# Patient Record
Sex: Female | Born: 1955 | Race: White | Hispanic: No | Marital: Married | Smoking: Never smoker
Health system: Southern US, Community
[De-identification: ages and names within clinical notes are randomized; demographics above are authoritative.]

## PROBLEM LIST (undated history)

## (undated) DIAGNOSIS — M1990 Unspecified osteoarthritis, unspecified site: Secondary | ICD-10-CM

## (undated) DIAGNOSIS — F419 Anxiety disorder, unspecified: Secondary | ICD-10-CM

## (undated) DIAGNOSIS — R112 Nausea with vomiting, unspecified: Secondary | ICD-10-CM

## (undated) DIAGNOSIS — R569 Unspecified convulsions: Secondary | ICD-10-CM

## (undated) DIAGNOSIS — Z9889 Other specified postprocedural states: Secondary | ICD-10-CM

## (undated) DIAGNOSIS — F32A Depression, unspecified: Secondary | ICD-10-CM

## (undated) DIAGNOSIS — D649 Anemia, unspecified: Secondary | ICD-10-CM

## (undated) DIAGNOSIS — C801 Malignant (primary) neoplasm, unspecified: Secondary | ICD-10-CM

## (undated) DIAGNOSIS — R110 Nausea: Secondary | ICD-10-CM

## (undated) DIAGNOSIS — T4145XA Adverse effect of unspecified anesthetic, initial encounter: Secondary | ICD-10-CM

## (undated) DIAGNOSIS — B1910 Unspecified viral hepatitis B without hepatic coma: Secondary | ICD-10-CM

## (undated) DIAGNOSIS — I959 Hypotension, unspecified: Secondary | ICD-10-CM

## (undated) DIAGNOSIS — K8001 Calculus of gallbladder with acute cholecystitis with obstruction: Secondary | ICD-10-CM

## (undated) DIAGNOSIS — Z8659 Personal history of other mental and behavioral disorders: Secondary | ICD-10-CM

## (undated) DIAGNOSIS — Z8619 Personal history of other infectious and parasitic diseases: Secondary | ICD-10-CM

## (undated) DIAGNOSIS — F329 Major depressive disorder, single episode, unspecified: Secondary | ICD-10-CM

## (undated) DIAGNOSIS — Z8669 Personal history of other diseases of the nervous system and sense organs: Secondary | ICD-10-CM

## (undated) HISTORY — DX: Unspecified convulsions: R56.9

## (undated) HISTORY — DX: Anxiety disorder, unspecified: F41.9

## (undated) HISTORY — DX: Anemia, unspecified: D64.9

## (undated) HISTORY — PX: WRIST SURGERY: SHX841

## (undated) HISTORY — PX: BUNIONECTOMY: SHX129

## (undated) HISTORY — PX: FOOT SURGERY: SHX648

## (undated) HISTORY — DX: Hypotension, unspecified: I95.9

## (undated) HISTORY — PX: COLONOSCOPY: SHX174

## (undated) HISTORY — PX: KNEE SURGERY: SHX244

## (undated) HISTORY — DX: Depression, unspecified: F32.A

## (undated) HISTORY — DX: Major depressive disorder, single episode, unspecified: F32.9

## (undated) HISTORY — DX: Unspecified viral hepatitis B without hepatic coma: B19.10

---

## 1990-03-18 HISTORY — PX: DIAGNOSTIC LAPAROSCOPY: SUR761

## 2009-05-19 ENCOUNTER — Ambulatory Visit: Payer: Self-pay | Admitting: Family Medicine

## 2009-05-19 DIAGNOSIS — M19049 Primary osteoarthritis, unspecified hand: Secondary | ICD-10-CM

## 2009-08-03 ENCOUNTER — Encounter: Payer: Self-pay | Admitting: Family Medicine

## 2009-09-14 ENCOUNTER — Ambulatory Visit: Payer: Self-pay | Admitting: Family Medicine

## 2009-09-14 DIAGNOSIS — R5381 Other malaise: Secondary | ICD-10-CM

## 2009-09-14 DIAGNOSIS — R5383 Other fatigue: Secondary | ICD-10-CM

## 2009-09-14 DIAGNOSIS — F419 Anxiety disorder, unspecified: Secondary | ICD-10-CM

## 2009-09-14 DIAGNOSIS — F329 Major depressive disorder, single episode, unspecified: Secondary | ICD-10-CM

## 2009-09-15 ENCOUNTER — Telehealth: Payer: Self-pay | Admitting: *Deleted

## 2009-10-11 ENCOUNTER — Encounter: Payer: Self-pay | Admitting: Family Medicine

## 2009-10-11 ENCOUNTER — Ambulatory Visit: Payer: Self-pay | Admitting: Family Medicine

## 2009-10-11 DIAGNOSIS — G4700 Insomnia, unspecified: Secondary | ICD-10-CM

## 2009-10-11 DIAGNOSIS — R1032 Left lower quadrant pain: Secondary | ICD-10-CM

## 2009-10-15 DIAGNOSIS — F329 Major depressive disorder, single episode, unspecified: Secondary | ICD-10-CM | POA: Insufficient documentation

## 2010-04-17 ENCOUNTER — Encounter: Payer: Self-pay | Admitting: *Deleted

## 2010-04-17 NOTE — Assessment & Plan Note (Signed)
Summary: NP,tcb   Vital Signs:  Patient profile:   55 year old female Height:      62.5 inches Weight:      155.4 pounds BMI:     28.07 Temp:     97.6 degrees F Pulse rate:   70 / minute BP sitting:   117 / 65  (right arm) Cuff size:   regular  Vitals Entered By: Garen Grams LPN (May 20, 6787 10:46 AM) CC: New patient Is Patient Diabetic? No Pain Assessment Patient in pain? yes     Location: left wrist   Primary Care Provider:  Alvia Grove DO  CC:  New patient.  History of Present Illness: 55 yo female, new pt, recently moved to [facility].  Needs to establish primary care.  reviewed medical, surgical, family, social hx with pt. Only acute concern is left wrist pain.  remote hx of cyst removal from that wrist. Current pain described as numbess and tigling , not localized to specific fingers.  Endorses numbness and tingling in all fingers, some in her palm and occasional twinges up arm.  Has tried splint before with little relief. Able to use hand appropriatley and denies weakness or muscle atrophy  Habits & Providers  Alcohol-Tobacco-Diet     Alcohol drinks/day: 1     Alcohol type: wine     Tobacco Status: never     Diet Counseling: not indicated; diet is assessed to be healthy  Exercise-Depression-Behavior     Does Patient Exercise: yes     Type of exercise: walking about 20 mintues     Have you felt down or hopeless? yes     Have you felt little pleasure in things? yes     Depression Counseling: not indicated; screening negative for depression     Drug Use: no  Past History:  Family History: Last updated: 05/19/2009 Family History of CAD Female 1st degree relative <60  Social History: Last updated: 05/19/2009 Husband, pt and 2 children recently moved from [place] (Apr 08, 2009),  Adopted at 55 year of age, [place]56 yo son very difficult right now.  At [employer], making friends, but the wrong friends.  Into drus, alcholol, and smoking.  Taking the  car without permission.  Son has ADHD, been on meds, takes them occasionaly. Daughter also adopted Married Never Smoked Drug use-no Regular exercise-yes  Risk Factors: Alcohol Use: 1 (05/19/2009) Exercise: yes (05/19/2009)  Risk Factors: Smoking Status: never (05/19/2009)  Past Medical History: chronic low back pain hypotension  Past Surgical History: ACL 1998 left hand cyst removal  Family History: Family History of CAD Female 1st degree relative <60  Social History: Husband, pt and 2 children recently moved from [place] (Apr 08, 2009),  Adopted at 55 year of age, [place]18 yo son very difficult right now.  At [employer], making friends, but the wrong friends.  Into drus, alcholol, and smoking.  Taking the car without permission.  Son has ADHD, been on meds, takes them occasionaly. Daughter also adopted Married Never Smoked Drug use-no Regular exercise-yes Smoking Status:  never Does Patient Exercise:  yes Drug Use:  no  Physical Exam  Lungs:  Normal respiratory effort, chest expands symmetrically. Lungs are clear to auscultation, no crackles or wheezes. Heart:  Normal rate and regular rhythm. S1 and S2 normal without gallop, murmur, click, rub or other extra sounds.   Wrist/Hand Exam  Skin:    Intact with no erythema; no scarring.  Inspection:    Inspection is normal.    Palpation:    Non-tender to palpation bilaterally.    Vascular:    Radial, ulnar, brachial, and axillary pulses 2+ and symmetric; capillary refill less than 2 seconds; no evidence of ischemia, clubbing, or cyanosis.    Sensory:    Gross sensation intact in the upper extremities.    Motor:    Normal strength in the upper extremities.    Reflexes:    Normal reflexes in the upper extremities.    Wrist Exam:    Right:    Inspection:  Normal    Palpation:  Normal    Stability:  stable    Tenderness:  no    Swelling:  no    Erythema:  no    Range of Motion:        Flexion-Active: 80 degrees       Extension-Active: 80 degrees       Radial Deviation-Active: 15 degrees       Ulnar Deviation-Active: 30 degrees       Flexion-Passive: 80 degrees       Extension-Passive: 80 degrees       Radial Deviation-Passive: 15 degrees       Ulnar Deviation-Passive: 30 degrees    Left:    Inspection:  Normal    Palpation:  Normal    Stability:  stable    Tenderness:  no    Swelling:  no    Erythema:  no    Range of Motion:       Flexion-Active: 80 degrees       Extension-Active: 80 degrees       Radial Deviation-Active: 15 degrees       Ulnar Deviation-Active: 30 degrees       Flexion-Passive: 80 degrees       Extension-Passive: 80 degrees       Radial Deviation-Passive: 15 degrees       Ulnar Deviation-Passive: 30 degrees  Hand Exam:    Right:    Inspection:  Normal    Palpation:  Normal    Tenderness:  no    Swelling:  no    Erythema:  no    Left:    Inspection:  Normal    Palpation:  Normal    Tenderness:  no    Swelling:  no    Erythema:  no  Tinel's:    Tinel's negative right,  + L-carpal.    Phalen's Compression:    Right negative    Left < 15 seconds Carpal Compression:    Right negative    Left negative Elbow Flexion:    Right negative    Left negative Snuff Box Tenderness:    Right negative; Left positive TFC Tenderness:    Left negative Lunotriquetral Shuck:    Left negative Adson's:    Right negative; Left negative Nydia Bouton':    Right negative; Left negative Allen's:    Right negative; Left negative Intrinsic Tightness:    Right negative; Left negative Midcarpal Instability:    Right negative; Left negative   Impression & Recommendations:  Problem # 1:  HAND PAIN, LEFT (ICD-729.5) Assessment New Histoically and clinically sounds like nerve compression.  exam non specific. DDX includes ulnar neuropathy of the wrist vs carpal tunnel.  recently had cyst removed, nerve injury may have occured at that time.  Will start  scheduled ibuprofen and neurontin and monitor response/progression of pain and symptoms.  Further tx could include imaging and possibly surgery.  Patient Instructions: 1)  Nice to meet you. 2)  I am giving you 2 prescriptions for your hand pain.   3)  1. Neurontin 100mg .  Take first time at night, if it doesn't make you sleepy, it's ok to take during the day.  Take every 3 hours.  This medicine requires daily use before improvement is seen. 4)  Ibuprofen 800mg , i pill every 6 hours for 2 weeks. 5)  Follow up in 2 weeks, if not better will consult hand surgeon.

## 2010-04-17 NOTE — Progress Notes (Signed)
Summary: phn msg  Phone Note Call from Patient Call back at [facility] Phone [phone]   Caller: Patient Summary of Call: dosage of Prozac is 60mg   Initial call taken by: De Nurse,  September 15, 2009 11:51 AM  Follow-up for Phone Call        pt is calling back about meds Follow-up by: De Nurse,  September 19, 2009 11:02 AM  Additional Follow-up for Phone Call Additional follow up Details #1::        gave her above info & she repeated it back. appt made for 10/11/09 Additional Follow-up by: Golden Circle RN,  September 19, 2009 1:51 PM    New/Updated Medications: EFFEXOR XR 37.5 MG XR24H-CAP (VENLAFAXINE HCL) take 1 pill by mouth daily Prescriptions: EFFEXOR XR 37.5 MG XR24H-CAP (VENLAFAXINE HCL) take 1 pill by mouth daily  #30 x 2   Entered and Authorized by:   Alvia Grove DO   Signed by:   Alvia Grove DO on 09/19/2009   Method used:   Electronically to        [facility]* (retail)       [address]       [address] Shipping/mailing       [place], [place]  [address]       Ph: [phone]       Fax: [phone]   RxID:   [id number]    Please call pt and give her the following taper instructions: Today - day 7: Take Prozac 40mg  by mouth daily x 1 week (reduction from current dose of 60mg  daily) week 2: for days 7-14: take prozac 20mg  by mouth daily AND start Effexor 37.5mg  by mouth daily  week 3: for day 15 - 21: STOP prozac and continue taking Effexor 37.5mg  daily  Make follow up appt in 3 weeks

## 2010-04-17 NOTE — Assessment & Plan Note (Signed)
Summary: f/u meds/[place]/   Vital Signs:  Patient profile:   55 year old female Height:      62.5 inches Weight:      156.2 pounds BMI:     28.22 Temp:     98.1 degrees F oral Pulse rate:   72 / minute BP sitting:   119 / 81  (left arm) Cuff size:   regular  Vitals Entered By: Garen Grams LPN (October 11, 2009 10:53 AM) CC: f/u wrist pain; meds Is Patient Diabetic? No   Primary Care Provider:  Alvia Grove DO  CC:  f/u wrist pain; meds.  History of Present Illness: 55 yo female here for f/u left wrist pain, depression and new concern of abd pain.  1. L wrist pain:  much improved.  Started using Voltaren gel QID and pain is so much improved she can now use as needed.  No weakness, no numbness, no tingling. 2. depression: Switched from prozac to effexor over the past few weeks.  Currently on effexor 37.5mg  daily.  Feels a little bit better, but still sleepy thru the day and has decreased energy. Not sleeping well at night, difficulty falling asleep and staying asleep.  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  3. Abd pain: long hx of infertility and irreg periods.  However, pt's LMP was about 18 months ago.  So, she is likely in menapause.  Last pap was at [facility] back in June.  She localizes her abd pain to her lower abd and states "it's ovary pain".  Pain is now gone and only lasted 2 days.  No bleeding, no dysuria, no fevers, no n/v/d/c.      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 Take 1 Pill By Mouth Daily 2)  Ambien  5 Mg Tabs (Zolpidem Tartrate) .... Take 1 Pill By Mouth At Bedtime As Needed Insomnia  Allergies (verified): No Known Drug Allergies  Past History:  Past Surgical History: Last updated: 05/19/2009 ACL 1998 left hand cyst removal  Family History: Last updated: 05/19/2009 Family History of CAD Female 1st degree relative <60  Social History: Last updated: 10/11/2009 Husband, pt and 2 children recently moved from [place] (Apr 08, 2009),  Adopted at 55 year of age, [place]32 yo son very difficult right now.  At [employer], making friends, but the wrong friends.  Into drugs, alcholol, and smoking.  Taking the car without permission.  Son has ADHD, been on meds, takes them occasionaly. Daughter also adopted Married - husband is Horton Finer and is involved with the new [employer].  Never Smoked Drug use-no Regular exercise-yes  Risk Factors: Alcohol Use: 0 (09/14/2009) Exercise: yes (05/19/2009)  Risk Factors: Smoking Status: never (10/11/2009)  Past Medical History: chronic low back pain hypotension infertility Depression  Social History: Husband, pt and 2 children recently moved from [place] (Apr 08, 2009),  Adopted at 55 year of age, [place]55 yo son very difficult right now.  At [employer], making friends, but the  wrong friends.  Into drugs, alcholol, and smoking.  Taking the car without permission.  Son has ADHD, been on meds, takes them occasionaly. Daughter also adopted Married - husband is Horton Finer and is involved with the new [employer].  Never Smoked Drug use-no Regular exercise-yes  Review of Systems       The patient complains of abdominal pain.  The patient denies fever, weight loss, weight gain, dyspnea on exertion, abnormal bleeding, and enlarged lymph nodes.    Physical Exam  General:  Vs reviewed, alert, well-developed, well-nourished, and well-hydrated.   Lungs:  Normal respiratory effort, chest expands symmetrically.  Lungs are clear to auscultation, no crackles or wheezes. Heart:  Normal rate and regular rhythm. S1 and S2 normal without gallop, murmur, click, rub or other extra sounds. Abdomen:  soft, non-tender, normal bowel sounds, no distention, no masses, no guarding, no rebound tenderness, no hepatomegaly, and no splenomegaly.   Msk:  L wrist: full ROM non tender  no swelling, no redness Neurologic:  alert & oriented X3 and cranial nerves II-XII intact.   Skin:  turgor normal and color normal.   Psych:  Oriented X3, memory intact for recent and remote, normally interactive, good eye contact, not anxious appearing, not depressed appearing, not agitated, and not suicidal.     Impression & Recommendations:  Problem # 1:  DEPRESSION, MILD, RECURRENT (ICD-296.31) Assessment Improved Feels slight improvement with effexor, will increase to 75mg  daily and see how she does with higher dose.   Will give short course of ambien to help her sleep and rest at night. I think the insomnia will likely resolve once she returns to work and increses her activity.  Orders: [facility]- Est  Level 4 ([address])  Problem # 2:  ABDOMINAL PAIN, LOWER (ICD-789.09) Assessment: New Pain completely resolved, no bledding vaginally.  Will get records from [facility] for last pelvic/pap. No imagaing at this time as pain has resolved, however if it returns may consider trans-vag u/s.  Orders: [facility]- Est  Level 4 (60454)  Problem # 3:  HAND PAIN, LEFT (ICD-729.5) Assessment: Improved better.  will continue voltaren gel as needed.  Orders: [facility]- Est  Level 4 (99214)  Complete Medication List: 1)  Effexor Xr 75 Mg Xr24h-cap (Venlafaxine hcl) .... Take 1 pill by mouth daily 2)  Ambien 5 Mg Tabs (Zolpidem tartrate) .... Take 1 pill by mouth at bedtime as needed insomnia  Patient Instructions: 1)  Increase Effexor dose to 75mg  daily, let me know if you have any problems. 2)  Take sleeping pill as you need it for insomnia  3)  If your  abdominal pain returns or you have bleeding, please see me right away. 4)  I will review your records from [place] Prescriptions: AMBIEN 5 MG TABS (ZOLPIDEM TARTRATE) take 1 pill by mouth at bedtime as needed insomnia  #30 x 5   Entered and Authorized by:   Alvia Grove DO   Signed by:   Alvia Grove DO on 10/15/2009   Method used:   Handwritten   RxID:   [id number] EFFEXOR XR 75 MG XR24H-CAP (VENLAFAXINE HCL) take 1 pill by mouth daily  #30 x 5   Entered and Authorized by:   Alvia Grove DO   Signed by:   Alvia Grove DO on 10/11/2009   Method used:   Electronically to        [facility]* (retail)       [address]  [address] Shipping/mailing       [place], [place]  [address]       Ph: [phone]       Fax: [phone]   RxID:   [id number]

## 2010-04-17 NOTE — Miscellaneous (Signed)
Summary: ROI  ROI   Imported By: Clydell Hakim 10/13/2009 15:21:13  _____________________________________________________________________  External Attachment:    Type:   Image     Comment:   External Document

## 2010-04-17 NOTE — Assessment & Plan Note (Signed)
Summary: f/up,tcb   Vital Signs:  Patient profile:   55 year old female Height:      62.5 inches Weight:      158.9 pounds BMI:     28.70 Temp:     98.4 degrees F oral Pulse rate:   63 / minute BP sitting:   109 / 73  (left arm) Cuff size:   regular  Vitals Entered By: Garen Grams LPN (September 14, 2009 4:23 PM) CC: f/u hand pain Is Patient Diabetic? No Pain Assessment Patient in pain? no        Primary Care Provider:  Alvia Grove DO  CC:  f/u hand pain.  History of Present Illness: 55 yo female here for f/u of left hand pain and to discuss depression meds:  1. left hand pain:  Improving since last visit.  No weakness, just occasional tingling in her left thumb.  No injury.  Used ibuprofen with relief, but not complete resolution.  Pain only in her thumb.    2. depression: chronic, moderate per pt report.  recently moved to [facility] and has been on prozac for the past 3 years.  unsure of dose.  denies SI/HI, but does admit to feeling fatigued and low energy most of the time.  poor sleep, difficulty falling asleep. Major stress now is son who is having difficulty adjusting to the move and displaying behavioral problems (he is seeing a thearapist).  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 Apply 2 G Qid To Left Hand  Allergies (verified): No Known Drug Allergies  Past History:  Past Medical History: Last updated: 05/19/2009 chronic low back pain hypotension  Past Surgical History: Last updated: 05/19/2009 ACL 1998 left hand cyst removal  Family History: Last updated: 05/19/2009 Family History of CAD Female 1st degree relative  <60  Social History: Last updated: 05/19/2009 Husband, pt and 2 children recently moved from [place] (Apr 08, 2009),  Adopted at 55 year of age, [place]12 yo son very difficult right now.  At [employer], making friends, but the wrong friends.  Into drus, alcholol, and smoking.  Taking the car without permission.  Son has ADHD, been on meds, takes them occasionaly. Daughter also adopted Married Never Smoked Drug use-no Regular exercise-yes  Risk Factors: Alcohol Use: 0 (09/14/2009) Exercise: yes (05/19/2009)  Risk Factors: Smoking Status: never (09/14/2009)  Social History: Reviewed history from 05/19/2009 and no changes required. Husband, pt and 2 children recently moved from [place] (Apr 08, 2009),  Adopted at 55 year of age, Leida Lauth yo son very difficult right now.  At [employer] school, making friends, but the wrong friends.  Into drus, alcholol, and smoking.  Taking the car without permission.  Son has ADHD, been on meds, takes them occasionaly. Daughter also adopted Married Never Smoked Drug use-no Regular exercise-yes  Physical Exam  General:  Vs reviewed, alert, well-developed, well-nourished, and well-hydrated.   Lungs:  Normal respiratory effort, chest expands symmetrically. Lungs are clear to auscultation, no crackles or wheezes. Heart:  Normal rate and regular rhythm. S1 and S2 normal without gallop, murmur, click, rub or other extra sounds.  Wrist/Hand Exam  Sensory:    Gross sensation intact in the upper extremities.    Wrist Exam:    Right:    Inspection:  Normal    Palpation:  Normal    Stability:  stable    Tenderness:  no    Swelling:  no    Erythema:  no    Range of Motion:       Flexion-Active: 80 degrees       Extension-Active: 80 degrees       Radial Deviation-Active: 15 degrees       Ulnar Deviation-Active: 30 degrees       Flexion-Passive: 80 degrees       Extension-Passive: 80 degrees       Radial Deviation-Passive: 15  degrees       Ulnar Deviation-Passive: 30 degrees    Left:    Inspection:  Normal    Palpation:  Normal    Stability:  stable    Tenderness:  no    Swelling:  no    Erythema:  no    Range of Motion:       Flexion-Active: 80 degrees       Extension-Active: 80 degrees       Radial Deviation-Active: 15 degrees       Ulnar Deviation-Active: 30 degrees       Flexion-Passive: 80 degrees       Extension-Passive: 80 degrees       Radial Deviation-Passive: 15 degrees       Ulnar Deviation-Passive: 30 degrees  Hand Exam:    Right:    Inspection:  Normal    Palpation:  Normal    Tenderness:  no    Swelling:  no    Erythema:  no    Left:    Inspection:  Normal    Palpation:  Normal    Tenderness:  no    Swelling:  no    Erythema:  no   Impression & Recommendations:  Problem # 1:  HAND PAIN, LEFT (ICD-729.5) Assessment Improved No weakness, only some tingling pain. Will try voltaren gel QID see pt instructions Orders: [facility]- Est  Level 4 ([address])  Problem # 2:  DEPRESSION, MILD, RECURRENT (ICD-296.31) Has been using prozac for the past 3 years, unsure of dose, but notes it has been increased within the past 9 months.  Feels this is not working and would like to switch to a different class of medicine.   Will try SNRI.  Pt to call with current prozac dose and then will taper off to new med.  No SI/HI.  Orders: [facility]- Est  Level 4 ([address])  Complete Medication List: 1)  Voltaren 1 % Gel (Diclofenac sodium) .... Apply 2 g qid to left hand  Patient Instructions: 1)  Stop ibuprofen. 2)  Use Voltaren gel on your left hand 4 times a day for the next 2 weeks. 3)  Call me with your Prozac dose and I will taper you off of it and start a new medicine for your depression.  4)  Please schedule a follow-up appointment in 1 month.  Prescriptions: VOLTAREN 1 % GEL (DICLOFENAC SODIUM) apply 2 g QID to left hand  #1 tube x 0   Entered and Authorized by:   Alvia Grove DO   Signed by:    Alvia Grove DO on 09/19/2009   Method used:   Handwritten   RxID:   [id number]

## 2010-04-17 NOTE — Miscellaneous (Signed)
Summary: ibu refill?  Clinical Lists Changes pt did not keep f/u appt. wants refill on the 800mg  ibu. per notes if she was not better, she may be referred to a hand surgeon. I placed refill on pcp desk to address.Golden Circle RN  Aug 03, 2009 2:32 PM      Completed refill request x 84mo.  Pt needs to f/u in the office within the next month for evaluation prior to more reills or referral.

## 2010-04-25 NOTE — Miscellaneous (Signed)
Summary: Med Refill   Pt walked into clinic requesting a refill on her Effexor.  States that a request had been faxed by her pharmacy.  This medication not loaded in her chart.  After verifying that patient has been prescribed this med, I called in the refill verbally.  Dennison Nancy RN  April 17, 2010 12:05 PM

## 2010-05-17 ENCOUNTER — Encounter: Payer: Self-pay | Admitting: Family Medicine

## 2010-05-17 ENCOUNTER — Ambulatory Visit (INDEPENDENT_AMBULATORY_CARE_PROVIDER_SITE_OTHER): Payer: Commercial Managed Care - PPO | Admitting: Family Medicine

## 2010-05-17 VITALS — BP 109/80 | HR 67 | Temp 98.3°F | Wt 159.6 lb

## 2010-05-17 DIAGNOSIS — R05 Cough: Secondary | ICD-10-CM

## 2010-05-17 DIAGNOSIS — R059 Cough, unspecified: Secondary | ICD-10-CM

## 2010-05-17 MED ORDER — FLUTICASONE PROPIONATE 50 MCG/ACT NA SUSP: 1.0000 | Freq: Every day | NASAL | Status: DC

## 2010-05-17 NOTE — Assessment & Plan Note (Signed)
Likely cause of symptoms with element of post nasal drip as well. Will treat with OTC decongestant, Flonase as below. Advised follow up as below. Red flags reviewed. Consider GERD as possible cause though no GERD symptoms reports (if no improvement would consider treat for this)

## 2010-05-17 NOTE — Progress Notes (Signed)
  Subjective:    Patient ID: Sheryl Howell, female    DOB: 03-Apr-1955, 55 y.o.   MRN: [id number]  URI  This is a chronic problem. The current episode started more than 1 month ago. The problem has been unchanged. There has been no fever. Associated symptoms include congestion, coughing, a plugged ear sensation, rhinorrhea, sinus pain and sneezing. Pertinent negatives include no abdominal pain, chest pain, diarrhea, dysuria, ear pain, headaches, joint pain, joint swelling, nausea, neck pain, rash, sore throat, swollen glands, vomiting or wheezing. She has tried nothing for the symptoms.  Denies GERD symptoms. Cough is worse at night but is present during the day as well. Notes post-nasal drip and itchy throat.     Review of Systems  HENT: Positive for congestion, rhinorrhea and sneezing. Negative for ear pain, sore throat and neck pain.   Respiratory: Positive for cough. Negative for wheezing.   Cardiovascular: Negative for chest pain.  Gastrointestinal: Negative for nausea, vomiting, abdominal pain and diarrhea.  Genitourinary: Negative for dysuria.  Musculoskeletal: Negative for joint pain.  Skin: Negative for rash.  Neurological: Negative for headaches.       Objective:   Physical Exam  Constitutional: She appears well-developed and well-nourished.  HENT:  Head: Normocephalic and atraumatic.  Right Ear: Tympanic membrane, external ear and ear canal normal.  Left Ear: Tympanic membrane, external ear and ear canal normal.  Nose: Nose normal.  Mouth/Throat: Uvula is midline, oropharynx is clear and moist and mucous membranes are normal. No posterior oropharyngeal edema or posterior oropharyngeal erythema.  Eyes: Conjunctivae are normal.  Cardiovascular: Normal rate, regular rhythm, S1 normal, S2 normal and normal heart sounds.   No murmur heard. Pulmonary/Chest: Effort normal and breath sounds normal. No accessory muscle usage. Not tachypneic. No respiratory distress.  Skin: She is  not diaphoretic.          Assessment & Plan:

## 2010-05-17 NOTE — Patient Instructions (Addendum)
Use Flonase as directed to help relieve symptoms. You can use an OTC cough medication to help with symptoms especially at night. If these symptoms are not improving come back in two weeks.

## 2010-06-11 ENCOUNTER — Other Ambulatory Visit: Payer: Self-pay | Admitting: Family Medicine

## 2010-06-11 DIAGNOSIS — Z1231 Encounter for screening mammogram for malignant neoplasm of breast: Secondary | ICD-10-CM

## 2010-06-28 ENCOUNTER — Ambulatory Visit (INDEPENDENT_AMBULATORY_CARE_PROVIDER_SITE_OTHER): Payer: Commercial Managed Care - PPO | Admitting: Family Medicine

## 2010-06-28 ENCOUNTER — Ambulatory Visit (HOSPITAL_COMMUNITY)
Admission: RE | Admit: 2010-06-28 | Discharge: 2010-06-28 | Disposition: A | Discharge status: Home / Self Care | Payer: 59 | Source: Ambulatory Visit | Attending: Family Medicine | Admitting: Family Medicine

## 2010-06-28 ENCOUNTER — Encounter: Payer: Self-pay | Admitting: Family Medicine

## 2010-06-28 DIAGNOSIS — M25539 Pain in unspecified wrist: Secondary | ICD-10-CM | POA: Insufficient documentation

## 2010-06-28 DIAGNOSIS — M79609 Pain in unspecified limb: Secondary | ICD-10-CM

## 2010-06-28 DIAGNOSIS — M19039 Primary osteoarthritis, unspecified wrist: Secondary | ICD-10-CM | POA: Insufficient documentation

## 2010-06-28 DIAGNOSIS — M25532 Pain in left wrist: Secondary | ICD-10-CM

## 2010-06-28 NOTE — Assessment & Plan Note (Signed)
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  Seemed to have good response to voltaren gel.  Will get xray to evaluate for signs of arthritis and possible fracture (?scaphoid) although per history not likely and explained not very sensitive.  Will restart treatment with voltaren gel, if she does not get good response, will refer to sports medicine.

## 2010-06-28 NOTE — Patient Instructions (Signed)
Will get xray today,  I will give you a call with results. Restart voltaren gel If you continue to have problems, will discuss referral

## 2010-06-28 NOTE — Progress Notes (Signed)
  Subjective:    Patient ID: Sheryl Howell, female    DOB: 12/02/1955, 55 y.o.   MRN: [id number]  HPI Chronic left hand pain:  described as achy pain for 1 year, intermittent right hand dominant, worse with activity such as opening jars or holding book pages open.  No worsening in Am or PM.  No other joints involved.  No numbness, tingling or weakness.  Has history of ganglion cyst removal 2-3 years ago distally to the area of pain.  Has been using Voltaren gel with some relief but has not used any recently.  She is concerned that voltaren treatment is 'a bandaid" and would like xrays.     Review of Systemsno fever, chills, rash, joint swelling.  See HPI     Objective:   Physical Exam  Musculoskeletal:       Left hand with pain at [facility]st [facility].  Full range of motion with some pain on thumb opposition.  Pain over 1st CMC.  Negative finklestein, tinel. No pain over radial styloid.      no swelling or erythema.    Assessment & Plan:

## 2010-06-29 ENCOUNTER — Other Ambulatory Visit: Payer: Self-pay | Admitting: Family Medicine

## 2010-06-29 DIAGNOSIS — M19049 Primary osteoarthritis, unspecified hand: Secondary | ICD-10-CM

## 2010-06-29 MED ORDER — DICLOFENAC SODIUM 1 % TD GEL: 1.0000 "application " | Freq: Four times a day (QID) | TRANSDERMAL | Status: DC

## 2010-06-29 NOTE — Assessment & Plan Note (Signed)
Discussed xray finding, of MC arthritis.  Advised may try oral ibuprofen 600-800 tid for several days and patient will try to see if she can back off to using only voltaren topically

## 2010-07-03 ENCOUNTER — Ambulatory Visit
Admission: RE | Admit: 2010-07-03 | Discharge: 2010-07-03 | Disposition: A | Discharge status: Home / Self Care | Payer: 59 | Source: Ambulatory Visit | Attending: *Deleted | Admitting: *Deleted

## 2010-07-03 DIAGNOSIS — Z1231 Encounter for screening mammogram for malignant neoplasm of breast: Secondary | ICD-10-CM

## 2010-07-04 ENCOUNTER — Other Ambulatory Visit: Payer: Self-pay | Admitting: Family Medicine

## 2010-07-04 ENCOUNTER — Telehealth: Payer: Self-pay | Admitting: Family Medicine

## 2010-07-04 NOTE — Telephone Encounter (Signed)
voltaren is back ordered everywhere.  I can write a script for ketoprofen 20% cream that has to be taken to a compounding pharmacy.  May not be covered by insurance. Roughly 30 dollars I think.    If pt would like this, I can leave it for her at the desk.

## 2010-07-04 NOTE — Telephone Encounter (Signed)
pts med voltarin is back ordered, is requesting alternate medication. Pt goes to outpatient pharmacy.

## 2010-07-05 NOTE — Telephone Encounter (Signed)
Pt would like this.  Advised I would send the message to MD and ans we would call her when it is ready for pickup. Laveda Demedeiros, Maryjo Rochester

## 2010-07-06 NOTE — Telephone Encounter (Signed)
Script to front to call

## 2010-07-06 NOTE — Telephone Encounter (Signed)
LMOVM for pt that Rx was ready for pickup.  She may take to [facility]

## 2010-07-30 ENCOUNTER — Ambulatory Visit (INDEPENDENT_AMBULATORY_CARE_PROVIDER_SITE_OTHER): Payer: 59 | Admitting: Psychiatry

## 2010-07-30 DIAGNOSIS — F4323 Adjustment disorder with mixed anxiety and depressed mood: Secondary | ICD-10-CM

## 2010-08-23 ENCOUNTER — Ambulatory Visit (INDEPENDENT_AMBULATORY_CARE_PROVIDER_SITE_OTHER): Payer: 59 | Admitting: Psychiatry

## 2010-08-23 DIAGNOSIS — F4323 Adjustment disorder with mixed anxiety and depressed mood: Secondary | ICD-10-CM

## 2010-09-17 ENCOUNTER — Ambulatory Visit (INDEPENDENT_AMBULATORY_CARE_PROVIDER_SITE_OTHER): Payer: 59 | Admitting: Psychiatry

## 2010-09-17 DIAGNOSIS — F4323 Adjustment disorder with mixed anxiety and depressed mood: Secondary | ICD-10-CM

## 2010-09-25 ENCOUNTER — Ambulatory Visit (INDEPENDENT_AMBULATORY_CARE_PROVIDER_SITE_OTHER): Payer: 59 | Admitting: Psychiatry

## 2010-09-25 DIAGNOSIS — F4323 Adjustment disorder with mixed anxiety and depressed mood: Secondary | ICD-10-CM

## 2010-10-03 ENCOUNTER — Ambulatory Visit (INDEPENDENT_AMBULATORY_CARE_PROVIDER_SITE_OTHER): Payer: 59 | Admitting: Psychiatry

## 2010-10-03 DIAGNOSIS — F4323 Adjustment disorder with mixed anxiety and depressed mood: Secondary | ICD-10-CM

## 2010-10-10 ENCOUNTER — Ambulatory Visit (INDEPENDENT_AMBULATORY_CARE_PROVIDER_SITE_OTHER): Payer: 59 | Admitting: Psychiatry

## 2010-10-10 DIAGNOSIS — F4323 Adjustment disorder with mixed anxiety and depressed mood: Secondary | ICD-10-CM

## 2010-10-17 ENCOUNTER — Ambulatory Visit (INDEPENDENT_AMBULATORY_CARE_PROVIDER_SITE_OTHER): Payer: 59 | Admitting: Psychiatry

## 2010-10-17 DIAGNOSIS — F4323 Adjustment disorder with mixed anxiety and depressed mood: Secondary | ICD-10-CM

## 2010-10-18 ENCOUNTER — Other Ambulatory Visit: Payer: Self-pay | Admitting: Family Medicine

## 2010-10-18 DIAGNOSIS — F329 Major depressive disorder, single episode, unspecified: Secondary | ICD-10-CM

## 2010-10-18 NOTE — Telephone Encounter (Signed)
Refill request

## 2010-10-19 MED ORDER — VENLAFAXINE HCL ER 75 MG PO CP24: 75.0000 mg | ORAL_CAPSULE | Freq: Every day | ORAL | Status: DC

## 2010-10-19 NOTE — Telephone Encounter (Signed)
Patient comes to office today wanting to know why the effexor had been denied. It is not on med list but Dr. Gomez Cleverly gave it to her in July 2011. It does not show up on med list. Also it was refilled in January 2012 but not entered on med list. Called pharmacy and she received 30 tabs and 5  Refills in January. Consulted with Dr. Deirdre Priest and he advises may send in and patient needs appointment before next refill is needed. Patient will schedule.

## 2010-10-24 ENCOUNTER — Ambulatory Visit (INDEPENDENT_AMBULATORY_CARE_PROVIDER_SITE_OTHER): Payer: 59 | Admitting: Psychiatry

## 2010-10-24 DIAGNOSIS — F4323 Adjustment disorder with mixed anxiety and depressed mood: Secondary | ICD-10-CM

## 2010-10-26 ENCOUNTER — Encounter: Payer: Self-pay | Admitting: Family Medicine

## 2010-10-26 ENCOUNTER — Ambulatory Visit (INDEPENDENT_AMBULATORY_CARE_PROVIDER_SITE_OTHER): Payer: 59 | Admitting: Family Medicine

## 2010-10-26 DIAGNOSIS — F330 Major depressive disorder, recurrent, mild: Secondary | ICD-10-CM

## 2010-10-26 DIAGNOSIS — F329 Major depressive disorder, single episode, unspecified: Secondary | ICD-10-CM

## 2010-10-26 MED ORDER — VENLAFAXINE HCL ER 75 MG PO CP24: 75.0000 mg | ORAL_CAPSULE | Freq: Every day | ORAL | Status: DC

## 2010-10-26 NOTE — Assessment & Plan Note (Signed)
Seeing therapist and does very well on effexor.  Has been stable on this medication.

## 2010-10-26 NOTE — Progress Notes (Signed)
  Subjective:    Patient ID: Sheryl Howell, female    DOB: Jun 21, 1955, 55 y.o.   MRN: [id number]  HPI Pt here for depression f/u.  She feels the effexor helps her a lot.  She has tried going off of it and feels she has more mood swings.  She sees a Paramedic at [facility].     Review of Systems Denies CP, SOB, HA, N/V/D, fever     Objective:   Physical Exam  Vital signs reviewed General appearance - alert, well appearing, and in no distress and oriented to person, place, and time Psych- normal mood and affect      Assessment & Plan:  DEPRESSION, MILD, RECURRENT Seeing therapist and does very well on effexor.  Has been stable on this medication.

## 2010-11-12 ENCOUNTER — Ambulatory Visit (INDEPENDENT_AMBULATORY_CARE_PROVIDER_SITE_OTHER): Payer: 59 | Admitting: Psychiatry

## 2010-11-12 DIAGNOSIS — F4323 Adjustment disorder with mixed anxiety and depressed mood: Secondary | ICD-10-CM

## 2010-12-10 ENCOUNTER — Ambulatory Visit (INDEPENDENT_AMBULATORY_CARE_PROVIDER_SITE_OTHER): Payer: 59 | Admitting: Psychiatry

## 2010-12-10 DIAGNOSIS — F4323 Adjustment disorder with mixed anxiety and depressed mood: Secondary | ICD-10-CM

## 2010-12-24 ENCOUNTER — Ambulatory Visit (INDEPENDENT_AMBULATORY_CARE_PROVIDER_SITE_OTHER): Payer: 59 | Admitting: Psychiatry

## 2010-12-24 DIAGNOSIS — F4323 Adjustment disorder with mixed anxiety and depressed mood: Secondary | ICD-10-CM

## 2011-01-07 ENCOUNTER — Ambulatory Visit (INDEPENDENT_AMBULATORY_CARE_PROVIDER_SITE_OTHER): Payer: 59 | Admitting: Psychiatry

## 2011-01-07 DIAGNOSIS — F4323 Adjustment disorder with mixed anxiety and depressed mood: Secondary | ICD-10-CM

## 2011-01-16 ENCOUNTER — Encounter: Payer: Self-pay | Admitting: Family Medicine

## 2011-01-16 ENCOUNTER — Ambulatory Visit (INDEPENDENT_AMBULATORY_CARE_PROVIDER_SITE_OTHER): Payer: 59 | Admitting: Family Medicine

## 2011-01-16 VITALS — BP 113/78 | HR 80 | Temp 98.2°F | Ht 63.0 in | Wt 149.0 lb

## 2011-01-16 DIAGNOSIS — N949 Unspecified condition associated with female genital organs and menstrual cycle: Secondary | ICD-10-CM

## 2011-01-16 DIAGNOSIS — R102 Pelvic and perineal pain: Secondary | ICD-10-CM

## 2011-01-16 DIAGNOSIS — R05 Cough: Secondary | ICD-10-CM

## 2011-01-16 MED ORDER — CHLORPHENIRAMINE-HYDROCODONE 8-10 MG/5ML PO LQCR: 5.0000 mL | Freq: Two times a day (BID) | ORAL | Status: DC | PRN

## 2011-01-16 MED ORDER — BENZONATATE 100 MG PO CAPS: 100.0000 mg | ORAL_CAPSULE | Freq: Four times a day (QID) | ORAL | Status: DC | PRN

## 2011-01-16 NOTE — Patient Instructions (Signed)
I will send you for a pelvic ultrasound to make sure your ovaries are normal in size  For your cough, please try the tessalon perles during the day and the tussionex at night  I will be out of the office for 3 weeks.  I will have my colleague call you with results if this comes back after I leave.

## 2011-01-17 ENCOUNTER — Ambulatory Visit (HOSPITAL_COMMUNITY)
Admission: RE | Admit: 2011-01-17 | Discharge: 2011-01-17 | Disposition: A | Discharge status: Home / Self Care | Payer: 59 | Source: Ambulatory Visit | Attending: Family Medicine | Admitting: Family Medicine

## 2011-01-17 DIAGNOSIS — N949 Unspecified condition associated with female genital organs and menstrual cycle: Secondary | ICD-10-CM | POA: Insufficient documentation

## 2011-01-17 DIAGNOSIS — R102 Pelvic and perineal pain: Secondary | ICD-10-CM

## 2011-01-17 DIAGNOSIS — Z780 Asymptomatic menopausal state: Secondary | ICD-10-CM | POA: Insufficient documentation

## 2011-01-17 NOTE — Assessment & Plan Note (Addendum)
2 weeks of cough without other symptoms. Discussed pros and cons of antibiotics. Decided with patient not to try antibiotics. Gave Tessalon Perles for daytime and Tussionex for nighttime relief. Asked patient to restart her Flonase in case this has an allergic component

## 2011-01-17 NOTE — Assessment & Plan Note (Signed)
Pelvic pain concerned the patient because patient has had a history of cysts. We'll check pelvic ultrasound today.

## 2011-01-17 NOTE — Progress Notes (Signed)
  Subjective:    Patient ID: Sheryl Howell, female    DOB: September 04, 1955, 55 y.o.   MRN: [id number]  HPI Cough-patient with 2 weeks of cough. This is a wet cough but she is not coughing up any mucus. She does not have any sinus symptoms with this. She denies any fevers. This cough seems to be present all the time and is not made worse by laying down. She takes a Claritin but she has not take her Flonase anymore. She does not think she is postnasal drip. Pelvic pain- patient with 3 days of pelvic pain. She states that she had pain starting on Monday that was sharp and it hurt her to stand. This went away after several hours. The pain returned on Tuesday but it was not as strong. Today she is sore on the left side. She denies any bleeding or discharge she denies any symptoms when she pees. Patient reports that when she was menstruating, she often had pain on the left side when she ovulated. This pain was similar to that pain but it was more intense. She has been postmenopausal for 3 years. She has no new sexual partners. No trouble of constipation or diarrhea   Review of Systems Denies CP, SOB, HA, N/V/D, fever     Objective:   Physical Exam  Vital signs reviewed General appearance - alert, well appearing, and in no distress and oriented to person, place, and time Heart - normal rate, regular rhythm, normal S1, S2, no murmurs, rubs, clicks or gallops Chest - clear to auscultation, no wheezes, rales or rhonchi, symmetric air entry, no tachypnea, retractions or cyanosis Eyes - pupils equal and reactive, extraocular eye movements intact, sclera anicteric Ears - left TM with some fullness right ear normal, left ear normal Nose - normal and patent, no erythema, discharge or polyps Abdominal-left lower quadrant was tender to palpation with deep pressure. She was not tender anywhere else. There were no masses felt.      Assessment & Plan:

## 2011-01-18 ENCOUNTER — Telehealth: Payer: Self-pay | Admitting: Family Medicine

## 2011-01-18 NOTE — Telephone Encounter (Signed)
[name] is calling to speak to Dr. Hulen Luster further about the ultrasound results.

## 2011-01-18 NOTE — Telephone Encounter (Signed)
Left VM . Called to let pt know results.  Completely normal ultrasound showing normal ovaries and uterus.

## 2011-01-18 NOTE — Telephone Encounter (Signed)
Further explained u/s.  Consider u/a if returns.  ? Kidney stone?

## 2011-01-28 ENCOUNTER — Ambulatory Visit (INDEPENDENT_AMBULATORY_CARE_PROVIDER_SITE_OTHER): Payer: 59 | Admitting: Psychiatry

## 2011-01-28 DIAGNOSIS — F4323 Adjustment disorder with mixed anxiety and depressed mood: Secondary | ICD-10-CM

## 2011-02-04 ENCOUNTER — Ambulatory Visit (INDEPENDENT_AMBULATORY_CARE_PROVIDER_SITE_OTHER): Payer: 59 | Admitting: Psychiatry

## 2011-02-04 DIAGNOSIS — F4323 Adjustment disorder with mixed anxiety and depressed mood: Secondary | ICD-10-CM

## 2011-02-18 ENCOUNTER — Ambulatory Visit (INDEPENDENT_AMBULATORY_CARE_PROVIDER_SITE_OTHER): Payer: 59 | Admitting: Psychiatry

## 2011-02-18 DIAGNOSIS — F4323 Adjustment disorder with mixed anxiety and depressed mood: Secondary | ICD-10-CM

## 2011-03-04 ENCOUNTER — Ambulatory Visit: Payer: 59 | Admitting: Psychiatry

## 2011-03-25 ENCOUNTER — Ambulatory Visit (INDEPENDENT_AMBULATORY_CARE_PROVIDER_SITE_OTHER): Payer: 59 | Admitting: Psychiatry

## 2011-03-25 DIAGNOSIS — F4323 Adjustment disorder with mixed anxiety and depressed mood: Secondary | ICD-10-CM

## 2011-03-26 ENCOUNTER — Ambulatory Visit (INDEPENDENT_AMBULATORY_CARE_PROVIDER_SITE_OTHER): Payer: 59 | Admitting: Professional

## 2011-03-26 DIAGNOSIS — F4323 Adjustment disorder with mixed anxiety and depressed mood: Secondary | ICD-10-CM

## 2011-04-08 ENCOUNTER — Ambulatory Visit (INDEPENDENT_AMBULATORY_CARE_PROVIDER_SITE_OTHER): Payer: 59 | Admitting: Psychiatry

## 2011-04-08 DIAGNOSIS — F4323 Adjustment disorder with mixed anxiety and depressed mood: Secondary | ICD-10-CM

## 2011-04-22 ENCOUNTER — Ambulatory Visit (INDEPENDENT_AMBULATORY_CARE_PROVIDER_SITE_OTHER): Payer: 59 | Admitting: Psychiatry

## 2011-04-22 DIAGNOSIS — F4323 Adjustment disorder with mixed anxiety and depressed mood: Secondary | ICD-10-CM

## 2011-05-06 ENCOUNTER — Ambulatory Visit (INDEPENDENT_AMBULATORY_CARE_PROVIDER_SITE_OTHER): Payer: 59 | Admitting: Psychiatry

## 2011-05-06 DIAGNOSIS — F4323 Adjustment disorder with mixed anxiety and depressed mood: Secondary | ICD-10-CM

## 2011-05-13 ENCOUNTER — Other Ambulatory Visit: Payer: Self-pay | Admitting: Family Medicine

## 2011-05-13 DIAGNOSIS — Z1231 Encounter for screening mammogram for malignant neoplasm of breast: Secondary | ICD-10-CM

## 2011-05-20 ENCOUNTER — Ambulatory Visit (INDEPENDENT_AMBULATORY_CARE_PROVIDER_SITE_OTHER): Payer: 59 | Admitting: Psychiatry

## 2011-05-20 DIAGNOSIS — F4323 Adjustment disorder with mixed anxiety and depressed mood: Secondary | ICD-10-CM

## 2011-05-27 ENCOUNTER — Ambulatory Visit (INDEPENDENT_AMBULATORY_CARE_PROVIDER_SITE_OTHER): Payer: 59 | Admitting: Family Medicine

## 2011-05-27 ENCOUNTER — Other Ambulatory Visit (HOSPITAL_COMMUNITY)
Admission: RE | Admit: 2011-05-27 | Discharge: 2011-05-27 | Disposition: A | Discharge status: Home / Self Care | Payer: 59 | Source: Ambulatory Visit | Attending: Family Medicine | Admitting: Family Medicine

## 2011-05-27 ENCOUNTER — Encounter: Payer: Self-pay | Admitting: Family Medicine

## 2011-05-27 VITALS — BP 115/65 | HR 72 | Temp 98.1°F | Ht 63.0 in | Wt 145.0 lb

## 2011-05-27 DIAGNOSIS — Z01419 Encounter for gynecological examination (general) (routine) without abnormal findings: Secondary | ICD-10-CM | POA: Insufficient documentation

## 2011-05-27 DIAGNOSIS — E663 Overweight: Secondary | ICD-10-CM

## 2011-05-27 DIAGNOSIS — Z0000 Encounter for general adult medical examination without abnormal findings: Secondary | ICD-10-CM

## 2011-05-27 DIAGNOSIS — M19049 Primary osteoarthritis, unspecified hand: Secondary | ICD-10-CM

## 2011-05-27 DIAGNOSIS — Z124 Encounter for screening for malignant neoplasm of cervix: Secondary | ICD-10-CM

## 2011-05-27 DIAGNOSIS — J069 Acute upper respiratory infection, unspecified: Secondary | ICD-10-CM

## 2011-05-27 NOTE — Assessment & Plan Note (Signed)
Continues to bother her.  Not worse.  Advised to start tylenol 3g daily.  See back if not improved.

## 2011-05-27 NOTE — Assessment & Plan Note (Signed)
With recurrent nosebleeds associated with congestion.  Advised saline sprays, claritin.  To return if not improved.

## 2011-05-27 NOTE — Assessment & Plan Note (Signed)
Check cmet, lipid today.  Pap done.  Does not want flu shot.

## 2011-05-27 NOTE — Progress Notes (Signed)
  Subjective:    Patient ID: Sheryl Howell, female    DOB: 08/04/1955, 56 y.o.   MRN: [id number]  HPI  URI- intermittent congestion, nasal drainage, ear fullness and popping x 1 month.  Having some bleeding on right side of nose intermittently--mostly small amount.  Did not think nasonex helped.  Felt that claritin was drying.  No HA, fevers, SOB, CP  Rash on arm- small area of erythema, induration on left elbow that looks like eczema type rash.  No hx of eczema.  Has tried lotion which helps some.  Has not tried any cortisone.  No new contacts, detergents, perfumes.  No other areas no fevers  Traveling to [place] in July.  Wants to know if needs any vaccines.  Well woman- desires pap today.  No changes to family hx or PMH  Non smoker Review of Systems See above    Objective:   Physical Exam Vital signs reviewed General appearance - alert, well appearing, and in no distress and oriented to person, place, and time Heart - normal rate, regular rhythm, normal S1, S2, no murmurs, rubs, clicks or gallops Chest - clear to auscultation, no wheezes, rales or rhonchi, symmetric air entry, no tachypnea, retractions or cyanosis Ears - bilateral TM's and external ear canals normal, right ear normal, left ear normal Nose - normal and patent, mild erythema and swelling, clear to white discharge no bleeding GYN- external genetalia normal, without lesions.  Vagina normal color, rugations, without discharge.  Cervix normal color without lesions or discharge.         Assessment & Plan:

## 2011-05-28 LAB — COMPREHENSIVE METABOLIC PANEL
Alkaline Phosphatase: 92 U/L (ref 39–117)
BUN: 14 mg/dL (ref 6–23)
Creat: 0.84 mg/dL (ref 0.50–1.10)
Glucose, Bld: 83 mg/dL (ref 70–99)
Sodium: 140 mEq/L (ref 135–145)
Total Bilirubin: 0.4 mg/dL (ref 0.3–1.2)

## 2011-05-28 LAB — LDL CHOLESTEROL, DIRECT: Direct LDL: 129 mg/dL — ABNORMAL HIGH

## 2011-05-29 ENCOUNTER — Encounter: Payer: Self-pay | Admitting: Family Medicine

## 2011-06-03 ENCOUNTER — Ambulatory Visit (INDEPENDENT_AMBULATORY_CARE_PROVIDER_SITE_OTHER): Payer: 59 | Admitting: Psychiatry

## 2011-06-03 DIAGNOSIS — F4323 Adjustment disorder with mixed anxiety and depressed mood: Secondary | ICD-10-CM

## 2011-07-01 ENCOUNTER — Ambulatory Visit (INDEPENDENT_AMBULATORY_CARE_PROVIDER_SITE_OTHER): Payer: 59 | Admitting: Psychiatry

## 2011-07-01 DIAGNOSIS — F4323 Adjustment disorder with mixed anxiety and depressed mood: Secondary | ICD-10-CM

## 2011-07-04 ENCOUNTER — Ambulatory Visit
Admission: RE | Admit: 2011-07-04 | Discharge: 2011-07-04 | Disposition: A | Discharge status: Home / Self Care | Payer: 59 | Source: Ambulatory Visit | Attending: Family Medicine | Admitting: Family Medicine

## 2011-07-04 DIAGNOSIS — Z1231 Encounter for screening mammogram for malignant neoplasm of breast: Secondary | ICD-10-CM

## 2011-07-09 ENCOUNTER — Other Ambulatory Visit: Payer: Self-pay | Admitting: Family Medicine

## 2011-07-09 DIAGNOSIS — R928 Other abnormal and inconclusive findings on diagnostic imaging of breast: Secondary | ICD-10-CM

## 2011-07-10 ENCOUNTER — Ambulatory Visit
Admission: RE | Admit: 2011-07-10 | Discharge: 2011-07-10 | Disposition: A | Discharge status: Home / Self Care | Payer: 59 | Source: Ambulatory Visit | Attending: Family Medicine | Admitting: Family Medicine

## 2011-07-10 DIAGNOSIS — R928 Other abnormal and inconclusive findings on diagnostic imaging of breast: Secondary | ICD-10-CM

## 2011-07-11 ENCOUNTER — Other Ambulatory Visit: Payer: Self-pay | Admitting: Family Medicine

## 2011-07-15 ENCOUNTER — Ambulatory Visit (INDEPENDENT_AMBULATORY_CARE_PROVIDER_SITE_OTHER): Payer: 59 | Admitting: Psychiatry

## 2011-07-15 DIAGNOSIS — F4323 Adjustment disorder with mixed anxiety and depressed mood: Secondary | ICD-10-CM

## 2011-07-23 NOTE — Progress Notes (Signed)
Addended by: Ellery Plunk L on: 07/23/2011 05:07 PM   Modules accepted: Level of Service

## 2011-07-29 ENCOUNTER — Ambulatory Visit (INDEPENDENT_AMBULATORY_CARE_PROVIDER_SITE_OTHER): Payer: 59 | Admitting: Psychiatry

## 2011-07-29 DIAGNOSIS — F4323 Adjustment disorder with mixed anxiety and depressed mood: Secondary | ICD-10-CM

## 2011-08-15 ENCOUNTER — Ambulatory Visit (INDEPENDENT_AMBULATORY_CARE_PROVIDER_SITE_OTHER): Payer: 59 | Admitting: Psychiatry

## 2011-08-15 DIAGNOSIS — F4323 Adjustment disorder with mixed anxiety and depressed mood: Secondary | ICD-10-CM

## 2011-08-26 ENCOUNTER — Ambulatory Visit: Payer: 59 | Admitting: Psychiatry

## 2011-08-29 ENCOUNTER — Ambulatory Visit (INDEPENDENT_AMBULATORY_CARE_PROVIDER_SITE_OTHER): Payer: 59 | Admitting: Psychiatry

## 2011-08-29 DIAGNOSIS — F4323 Adjustment disorder with mixed anxiety and depressed mood: Secondary | ICD-10-CM

## 2011-09-09 ENCOUNTER — Ambulatory Visit (INDEPENDENT_AMBULATORY_CARE_PROVIDER_SITE_OTHER): Payer: 59 | Admitting: Psychiatry

## 2011-09-09 DIAGNOSIS — F4323 Adjustment disorder with mixed anxiety and depressed mood: Secondary | ICD-10-CM

## 2011-09-23 ENCOUNTER — Ambulatory Visit: Payer: 59 | Admitting: Psychiatry

## 2011-09-30 ENCOUNTER — Ambulatory Visit (INDEPENDENT_AMBULATORY_CARE_PROVIDER_SITE_OTHER): Payer: 59 | Admitting: Psychiatry

## 2011-09-30 DIAGNOSIS — F4323 Adjustment disorder with mixed anxiety and depressed mood: Secondary | ICD-10-CM

## 2011-10-21 ENCOUNTER — Ambulatory Visit (INDEPENDENT_AMBULATORY_CARE_PROVIDER_SITE_OTHER): Payer: 59 | Admitting: Psychiatry

## 2011-10-21 DIAGNOSIS — F4323 Adjustment disorder with mixed anxiety and depressed mood: Secondary | ICD-10-CM

## 2011-11-04 ENCOUNTER — Ambulatory Visit: Payer: 59 | Admitting: Psychiatry

## 2011-11-05 ENCOUNTER — Ambulatory Visit: Payer: 59 | Admitting: Psychiatry

## 2011-11-08 ENCOUNTER — Other Ambulatory Visit: Payer: Self-pay | Admitting: Family Medicine

## 2011-11-19 ENCOUNTER — Ambulatory Visit (INDEPENDENT_AMBULATORY_CARE_PROVIDER_SITE_OTHER): Payer: 59 | Admitting: Psychiatry

## 2011-11-19 DIAGNOSIS — F4323 Adjustment disorder with mixed anxiety and depressed mood: Secondary | ICD-10-CM

## 2011-11-23 ENCOUNTER — Emergency Department (HOSPITAL_COMMUNITY)
Admission: EM | Admit: 2011-11-23 | Discharge: 2011-11-23 | Disposition: A | Discharge status: Home / Self Care | Payer: 59 | Source: Home / Self Care | Attending: Emergency Medicine | Admitting: Emergency Medicine

## 2011-11-23 ENCOUNTER — Encounter (HOSPITAL_COMMUNITY): Payer: Self-pay | Admitting: Emergency Medicine

## 2011-11-23 DIAGNOSIS — K759 Inflammatory liver disease, unspecified: Secondary | ICD-10-CM

## 2011-11-23 LAB — POCT URINALYSIS DIP (DEVICE)
Hgb urine dipstick: NEGATIVE
Ketones, ur: NEGATIVE mg/dL
Nitrite: NEGATIVE
Protein, ur: NEGATIVE mg/dL
pH: 5.5 (ref 5.0–8.0)

## 2011-11-23 LAB — CBC WITH DIFFERENTIAL/PLATELET
Basophils Relative: 1 % (ref 0–1)
Eosinophils Absolute: 0.1 10*3/uL (ref 0.0–0.7)
HCT: 43.1 % (ref 36.0–46.0)
Hemoglobin: 15 g/dL (ref 12.0–15.0)
MCH: 31.6 pg (ref 26.0–34.0)
MCHC: 34.8 g/dL (ref 30.0–36.0)
Monocytes Absolute: 0.6 10*3/uL (ref 0.1–1.0)
Monocytes Relative: 12 % (ref 3–12)

## 2011-11-23 LAB — COMPREHENSIVE METABOLIC PANEL
Albumin: 4 g/dL (ref 3.5–5.2)
BUN: 9 mg/dL (ref 6–23)
Calcium: 9.9 mg/dL (ref 8.4–10.5)
Creatinine, Ser: 0.69 mg/dL (ref 0.50–1.10)
Total Protein: 7.2 g/dL (ref 6.0–8.3)

## 2011-11-23 MED ORDER — VENLAFAXINE HCL ER 37.5 MG PO CP24: 37.5000 mg | ORAL_CAPSULE | Freq: Every day | ORAL | Status: DC

## 2011-11-23 NOTE — ED Provider Notes (Addendum)
Chief Complaint  Patient presents with  . Urine concerns     History of Present Illness:    Sheryl Howell is a 56 year old female with a one half week history of dark, tea-colored urine, light, clear-colored stools, nausea, anorexia, excessive thirst, and itchy skin. She denies fever, chills, weight loss, sore throat, adenopathy, or abdominal pain. She's had no diarrhea or urinary symptoms. She has not been exposed to anyone with hepatitis A., B., or C. She denies any exposure to mono or cytomegalovirus. She thinks she has had vaccines for hepatitis A and B. She was in [place] in July of this year. She drinks a glass of wine about twice a week. She takes venlafaxine 75 mg daily for depression and anxiety. She's been taking this for about 2 years. She has had no recent change in her medications. She has been applying diclofenac gel for arthritis.  Review of Systems:  Other than noted above, the patient denies any of the following symptoms: Constitutional:  No fever, chills, fatigue, weight loss or anorexia. Lungs:  No cough or shortness of breath. Heart:  No chest pain, palpitations, syncope or edema.  No cardiac history. Abdomen:  No nausea, vomiting, hematememesis, melena, diarrhea, or hematochezia. GU:  No dysuria, frequency, urgency, or hematuria. Gyn:  No vaginal discharge, itching, abnormal bleeding, dyspareunia, or pelvic pain.  PMFSH:  Past medical history, family history, social history, meds, and allergies were reviewed along with nurse's notes.  No prior abdominal surgeries, past history of GI problems, STDs or GYN problems.  No history of aspirin or NSAID use.  No excessive alcohol intake.  Physical Exam:   Vital signs:  BP 131/64  Pulse 77  Temp 98.3 F (36.8 C) (Oral)  Resp 16  SpO2 100% Gen:  Alert, oriented, in no distress. Eyes: Sclera are icteric. Lungs:  Breath sounds clear and equal bilaterally.  No wheezes, rales or rhonchi. Heart:  Regular rhythm.  No gallops or murmers.    Abdomen:  Soft, nontender, no organomegaly or mass bowel sounds are normal. Liver is not enlarged or tender. Skin:  Clear, warm and dry.  No rash. Skin is mildly jaundiced.  Labs:   Results for orders placed during the hospital encounter of 11/23/11  POCT URINALYSIS DIP (DEVICE)      Component Value Range   Glucose, UA NEGATIVE  NEGATIVE mg/dL   Bilirubin Urine LARGE (*) NEGATIVE   Ketones, ur NEGATIVE  NEGATIVE mg/dL   Specific Gravity, Urine 1.015  1.005 - 1.030   Hgb urine dipstick NEGATIVE  NEGATIVE   pH 5.5  5.0 - 8.0   Protein, ur NEGATIVE  NEGATIVE mg/dL   Urobilinogen, UA 1.0  0.0 - 1.0 mg/dL   Nitrite NEGATIVE  NEGATIVE   Leukocytes, UA NEGATIVE  NEGATIVE  CBC WITH DIFFERENTIAL      Component Value Range   WBC 5.0  4.0 - 10.5 K/uL   RBC 4.74  3.87 - 5.11 MIL/uL   Hemoglobin 15.0  12.0 - 15.0 g/dL   HCT 40.9  81.1 - 91.4 %   MCV 90.9  78.0 - 100.0 fL   MCH 31.6  26.0 - 34.0 pg   MCHC 34.8  30.0 - 36.0 g/dL   RDW 78.2  95.6 - 21.3 %   Platelets 278  150 - 400 K/uL   Neutrophils Relative 58  43 - 77 %   Neutro Abs 2.9  1.7 - 7.7 K/uL   Lymphocytes Relative 28  12 - 46 %  Lymphs Abs 1.4  0.7 - 4.0 K/uL   Monocytes Relative 12  3 - 12 %   Monocytes Absolute 0.6  0.1 - 1.0 K/uL   Eosinophils Relative 1  0 - 5 %   Eosinophils Absolute 0.1  0.0 - 0.7 K/uL   Basophils Relative 1  0 - 1 %   Basophils Absolute 0.1  0.0 - 0.1 K/uL  COMPREHENSIVE METABOLIC PANEL      Component Value Range   Sodium 138  135 - 145 mEq/L   Potassium 4.1  3.5 - 5.1 mEq/L   Chloride 102  96 - 112 mEq/L   CO2 26  19 - 32 mEq/L   Glucose, Bld 88  70 - 99 mg/dL   BUN 9  6 - 23 mg/dL   Creatinine, Ser 5.64  0.50 - 1.10 mg/dL   Calcium 9.9  8.4 - 33.2 mg/dL   Total Protein 7.2  6.0 - 8.3 g/dL   Albumin 4.0  3.5 - 5.2 g/dL   AST 9518 (*) 0 - 37 U/L   ALT 3137 (*) 0 - 35 U/L   Alkaline Phosphatase 359 (*) 39 - 117 U/L   Total Bilirubin 7.0 (*) 0.3 - 1.2 mg/dL   GFR calc non Af Amer >90  >90  mL/min   GFR calc Af Amer >90  >90 mL/min    Other Labs Obtained at Urgent Care Center:  An acute hepatitis panel was obtained.  Results are pending at this time and we will call about any positive results.  Assessment:  The encounter diagnosis was Hepatitis.  This could be hepatitis A., B., or C., drug-related hepatitis, I doubt that is due to the venlafaxine since she's been taking this for 2 years, but it could be the diclofenac gel or could be autoimmune hepatitis.  Plan:   1.  The following meds were prescribed:   New Prescriptions   VENLAFAXINE XR (EFFEXOR XR) 37.5 MG 24 HR CAPSULE    Take 1 capsule (37.5 mg total) by mouth daily.   2.  The patient was instructed in symptomatic care and handouts were given. 3.  The patient was told to return if becoming worse in any way, if no better in 3 or 4 days, and given some red flag symptoms that would indicate earlier return. She was told to stop the diclofenac gel and to avoid use of Tylenol, nonsteroidal anti-inflammatory drugs of any kind, and alcohol. Discussed infectious precautions. Her dosage of venlafaxine was decreased to 37.5 mg. I do not think she needs to stop it completely.  Follow up:  The patient was told to follow up with Dr. Rob Bunting early next week. She will call back about results of her acute hepatitis panel.     Reuben Likes, MD 11/23/11 1647   Addendum:  The patient is acute hepatitis panel came back positive for hepatitis B. I called her and informed her of this result, however she had already gotten news and had contacted the health Department herself. I discussed the need for all of her family members to be tested, the need to have her husband use condoms with intercourse, and the need to take blood and bodily fluid precautions. She or he has no appointment set up with the gastroenterologist for today.  Reuben Likes, MD 11/25/11 [phone]

## 2011-11-23 NOTE — ED Notes (Signed)
Pt c/o a dark yellow urine x1 week... She denies: fever, vomiting, diarrhea, pain when urinating and pelvic/adb pressure... Has a loss of appetite w/frequent thirst and itchy skin... She is also c/o tan/chalky color stool x1 day... Pt has no signs of distress and is alert w/normal gait.

## 2011-11-24 LAB — HEPATITIS PANEL, ACUTE
HCV Ab: NEGATIVE
Hep B C IgM: POSITIVE — AB
Hepatitis B Surface Ag: POSITIVE — AB

## 2011-11-25 ENCOUNTER — Encounter: Payer: Self-pay | Admitting: Internal Medicine

## 2011-11-25 ENCOUNTER — Other Ambulatory Visit (INDEPENDENT_AMBULATORY_CARE_PROVIDER_SITE_OTHER): Payer: 59

## 2011-11-25 ENCOUNTER — Telehealth: Payer: Self-pay | Admitting: Gastroenterology

## 2011-11-25 ENCOUNTER — Ambulatory Visit (INDEPENDENT_AMBULATORY_CARE_PROVIDER_SITE_OTHER): Payer: 59 | Admitting: Internal Medicine

## 2011-11-25 VITALS — BP 100/60 | HR 80 | Ht 63.0 in | Wt 148.2 lb

## 2011-11-25 DIAGNOSIS — B1910 Unspecified viral hepatitis B without hepatic coma: Secondary | ICD-10-CM

## 2011-11-25 DIAGNOSIS — R7989 Other specified abnormal findings of blood chemistry: Secondary | ICD-10-CM

## 2011-11-25 DIAGNOSIS — R17 Unspecified jaundice: Secondary | ICD-10-CM

## 2011-11-25 LAB — HEPATIC FUNCTION PANEL
Albumin: 4 g/dL (ref 3.5–5.2)
Alkaline Phosphatase: 302 U/L — ABNORMAL HIGH (ref 39–117)
Total Protein: 7.3 g/dL (ref 6.0–8.3)

## 2011-11-25 LAB — BASIC METABOLIC PANEL
CO2: 26 mEq/L (ref 19–32)
Calcium: 9.3 mg/dL (ref 8.4–10.5)
Creatinine, Ser: 0.6 mg/dL (ref 0.4–1.2)
Glucose, Bld: 85 mg/dL (ref 70–99)

## 2011-11-25 LAB — PROTIME-INR: INR: 1.2 ratio — ABNORMAL HIGH (ref 0.8–1.0)

## 2011-11-25 NOTE — Telephone Encounter (Signed)
The pt is going to ger her final diagnoses of the type of Hepatitis and call back.  If this is not Hep C I will fit her in the schedule with the next available Dr.

## 2011-11-25 NOTE — Patient Instructions (Addendum)
Your physician has requested that you go to the basement for the following lab work before leaving today:  You will be contacted regarding your [place]

## 2011-11-25 NOTE — ED Notes (Signed)
Hepatitis B surface antigen positive, Hepatitis B Core antibody IgM positive.  Pt. notified by Dr. Lorenz Coaster and has f/u appointment with the GI doctor.  DHHS form completed and faxed to the [facility]. Vassie Moselle 11/25/2011

## 2011-11-25 NOTE — Telephone Encounter (Signed)
Pt was added to Dr Lauro Franklin schedule for today.  Kennyth Arnold was notified

## 2011-11-25 NOTE — Progress Notes (Signed)
Patient ID: Sheryl Howell, female   DOB: Jul 23, 1955, 56 y.o.   MRN: [id number]  SUBJECTIVE: HPI [name] is a 56 yo female with PMH of anxiety and depression who is seen in consultation at the request of Dr. Lorenz Coaster for evaluation of acute hepatitis B. She is alone today. The patient states over last 10 days she developed fatigue along with dark urine. This all started just before Labor Day. Initially she thought she may have been dehydrated and try to drink additional fluids. Then several days later she developed light-colored stools and decided to seek evaluation in urgent care. She also reports anorexia and nausea but no vomiting. She denies abdominal pain. She was unaware of her jaundice, but this was noted by the urgent care M.D..  She denies ascites, lower extremity edema, itching. No change in mental status, easy bruising, or bleeding. She denies bright red blood per rectum or melena.  She reports no prior history of liver disease. She did travel to [place] and return to the [place] on 10/17/2011. 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 She denies a history of blood transfusion. No tattoo. She reports being in a monogamous relationship. She denies IV drug use and no intranasal cocaine use.  Review of Systems  As per history of present illness, otherwise negative   Past Medical History  Diagnosis Date  . Hepatitis B   . Anxiety   . Depression   . Seizures   . Hypotension     Current Outpatient Prescriptions  Medication Sig Dispense Refill  . diclofenac sodium (VOLTAREN) 1 % GEL Apply 1 application topically 4 (four) times daily.  1 Tube  2  . venlafaxine XR (EFFEXOR XR) 37.5 MG 24 hr capsule Take 1 capsule (37.5 mg total) by mouth daily.  30 capsule  0  . DISCONTD: venlafaxine XR (EFFEXOR-XR) 75 MG 24 hr capsule TAKE 1 CAPSULE BY MOUTH ONCE DAILY  90 capsule  4    Allergies  Allergen Reactions  . Sulfa Antibiotics Nausea Only    Family  History  Problem Relation Age of Onset  . Heart disease      History  Substance Use Topics  . Smoking status: Never Smoker   . Smokeless tobacco: Never Used  . Alcohol Use: Yes    OBJECTIVE: BP 100/60  Pulse 80  Ht 5\' 3"  (1.6 m)  Wt 148 lb 3.2 oz (67.223 kg)  BMI 26.25 kg/m2 Constitutional: Well-developed and well-nourished. No distress. HEENT: Normocephalic and atraumatic. Oropharynx is clear and moist. No oropharyngeal exudate.  Positive scleral icterus. Neck: Neck supple. Trachea midline. Cardiovascular: Normal rate, regular rhythm and intact distal pulses. No M/R/G Pulmonary/chest: Effort normal and breath sounds normal. No wheezing, rales or rhonchi. Abdominal: Soft, nontender, nondistended. Bowel sounds active throughout. There are no masses palpable. No hepatosplenomegaly. Extremities: no clubbing, cyanosis, or edema Lymphadenopathy: No cervical adenopathy noted. Neurological: Alert and oriented to person place and time. Skin: Skin is warm and dry. No rashes noted. Psychiatric: Normal mood and affect. Behavior is normal.  Labs and Imaging -- CMP     Component Value Date/Time   NA 138 11/23/2011 1403   K 4.1 11/23/2011 1403   CL 102 11/23/2011 1403   CO2 26 11/23/2011 1403   GLUCOSE 88 11/23/2011 1403   BUN 9 11/23/2011 1403   CREATININE 0.69 11/23/2011 1403   CREATININE 0.84 05/27/2011 1700   CALCIUM 9.9 11/23/2011 1403   PROT 7.2 11/23/2011 1403  ALBUMIN 4.0 11/23/2011 1403   AST 1797* 11/23/2011 1403   ALT 3137* 11/23/2011 1403   ALKPHOS 359* 11/23/2011 1403   BILITOT 7.0* 11/23/2011 1403   GFRNONAA >90 11/23/2011 1403   GFRAA >90 11/23/2011 1403   CBC    Component Value Date/Time   WBC 5.0 11/23/2011 1403   RBC 4.74 11/23/2011 1403   HGB 15.0 11/23/2011 1403   HCT 43.1 11/23/2011 1403   PLT 278 11/23/2011 1403   MCV 90.9 11/23/2011 1403   MCH 31.6 11/23/2011 1403   MCHC 34.8 11/23/2011 1403   RDW 14.6 11/23/2011 1403   LYMPHSABS 1.4 11/23/2011 1403   MONOABS 0.6 11/23/2011 1403   EOSABS 0.1  11/23/2011 1403   BASOSABS 0.1 11/23/2011 1403    Hep B surg Ag + Hep B Core IgM + Hep C Ab neg Hep A IgM neg  ASSESSMENT AND PLAN: 56 yo female with PMH of anxiety and depression who is seen in consultation at the request of Dr. Lorenz Coaster for evaluation of acute hepatitis B manifested by jaundice, elevated serum transaminases, fatigue and anorexia  1. Hep B -- we discussed the natural history of acute hepatitis B today at length. Interestingly, I cannot pinpoint a risk factor for her, and she appears to be very low risk for this infection. I would like to repeat labs today to include hepatitis B surface antigen, hepatitis Be antigen, hepatitis B DNA quantitative, HIV, repeat hepatic function panel, INR, and CBC.  I also will send her for an ultrasound of her liver. At this point, except for her jaundice her liver is very well compensated. We discussed how the vast majority of patients with acute hepatitis B or able to clear the infection without the use of antiviral medication. I do want to watch her very closely over the coming weeks to ensure that her liver enzymes improve, and if they fail to improve in a relatively short period, I likely will begin her on anti-viral medication (tenofovir 300 mg daily).  She will contact us if develops abdominal pain, fever, bleeding, confusion, ascites or lower extremity edema.  Repeat labs in one week and I'll be in touch with the patient once we get labs from today and her ultrasound result

## 2011-11-26 ENCOUNTER — Telehealth (HOSPITAL_COMMUNITY): Payer: Self-pay | Admitting: *Deleted

## 2011-11-26 ENCOUNTER — Other Ambulatory Visit: Payer: Self-pay | Admitting: Gastroenterology

## 2011-11-26 ENCOUNTER — Telehealth: Payer: Self-pay | Admitting: Gastroenterology

## 2011-11-26 LAB — HIV ANTIBODY (ROUTINE TESTING W REFLEX): HIV: NONREACTIVE

## 2011-11-26 LAB — HEPATITIS B SURFACE ANTIGEN: Hepatitis B Surface Ag: POSITIVE — AB

## 2011-11-26 NOTE — Telephone Encounter (Signed)
LM for Pt to call me back about when her U/S is scheduled.

## 2011-11-26 NOTE — ED Notes (Signed)
Pt. called on VM on 9/9 and asked for her lab results.  9/10  I called pt. back and left a message to call. Pt. called back and said Dr. Lorenz Coaster had called her yesterday and given them to her.  She needed them so she could schedule her f/u appointment.  She thanked me for calling back. Vassie Moselle 11/26/2011

## 2011-11-27 ENCOUNTER — Other Ambulatory Visit: Payer: Self-pay | Admitting: *Deleted

## 2011-11-27 DIAGNOSIS — B179 Acute viral hepatitis, unspecified: Secondary | ICD-10-CM

## 2011-11-27 DIAGNOSIS — R17 Unspecified jaundice: Secondary | ICD-10-CM

## 2011-11-28 ENCOUNTER — Ambulatory Visit (HOSPITAL_COMMUNITY)
Admission: RE | Admit: 2011-11-28 | Discharge: 2011-11-28 | Disposition: A | Discharge status: Home / Self Care | Payer: 59 | Source: Ambulatory Visit | Attending: Internal Medicine | Admitting: Internal Medicine

## 2011-11-28 ENCOUNTER — Telehealth: Payer: Self-pay | Admitting: Internal Medicine

## 2011-11-28 ENCOUNTER — Other Ambulatory Visit (INDEPENDENT_AMBULATORY_CARE_PROVIDER_SITE_OTHER): Payer: 59

## 2011-11-28 DIAGNOSIS — R17 Unspecified jaundice: Secondary | ICD-10-CM

## 2011-11-28 DIAGNOSIS — R7989 Other specified abnormal findings of blood chemistry: Secondary | ICD-10-CM

## 2011-11-28 DIAGNOSIS — K7200 Acute and subacute hepatic failure without coma: Secondary | ICD-10-CM

## 2011-11-28 DIAGNOSIS — B179 Acute viral hepatitis, unspecified: Secondary | ICD-10-CM

## 2011-11-28 LAB — HEPATITIS B E ANTIGEN: Hepatitis Be Antigen: POSITIVE — AB

## 2011-11-28 LAB — CBC WITH DIFFERENTIAL/PLATELET
Basophils Relative: 0.9 % (ref 0.0–3.0)
Eosinophils Relative: 1.5 % (ref 0.0–5.0)
HCT: 40.6 % (ref 36.0–46.0)
Lymphs Abs: 1.4 10*3/uL (ref 0.7–4.0)
MCV: 93 fl (ref 78.0–100.0)
Monocytes Absolute: 0.9 10*3/uL (ref 0.1–1.0)
Neutro Abs: 3.1 10*3/uL (ref 1.4–7.7)
Platelets: 281 10*3/uL (ref 150.0–400.0)
WBC: 5.6 10*3/uL (ref 4.5–10.5)

## 2011-11-28 LAB — HEPATIC FUNCTION PANEL
AST: 1567 U/L — ABNORMAL HIGH (ref 0–37)
Total Bilirubin: 13.2 mg/dL — ABNORMAL HIGH (ref 0.3–1.2)

## 2011-11-29 ENCOUNTER — Telehealth: Payer: Self-pay | Admitting: Internal Medicine

## 2011-11-29 ENCOUNTER — Ambulatory Visit (HOSPITAL_COMMUNITY)
Admission: RE | Admit: 2011-11-29 | Discharge: 2011-11-29 | Disposition: A | Discharge status: Home / Self Care | Payer: 59 | Source: Ambulatory Visit | Attending: Internal Medicine | Admitting: Internal Medicine

## 2011-11-29 DIAGNOSIS — K8020 Calculus of gallbladder without cholecystitis without obstruction: Secondary | ICD-10-CM | POA: Insufficient documentation

## 2011-11-29 DIAGNOSIS — R7989 Other specified abnormal findings of blood chemistry: Secondary | ICD-10-CM | POA: Insufficient documentation

## 2011-11-29 DIAGNOSIS — B169 Acute hepatitis B without delta-agent and without hepatic coma: Secondary | ICD-10-CM | POA: Insufficient documentation

## 2011-11-29 NOTE — Telephone Encounter (Signed)
Informed pt we received her lab results and Dr Rhea Belton has not reviewed them yet; however, her Bilirubin is up from 4 days ago. Her U/S results are back and I will have Dr Rhea Belton look at them between his procedures upstairs. Pt stated understanding.

## 2011-11-29 NOTE — Telephone Encounter (Signed)
I called and spoke to Sheryl Howell by phone.  We discussed the lab she had drawn yesterday. LFTs continue to be abnormal but transaminases are slightly decreased. Total bilirubin is up, which is not surprising given this degree of hepatic cell injury. INR is stable, which continues to be the most reassuring factor. She has not had any evidence of bleeding, ascites, lower extremity edema or confusion. She continues to be fatigued and tired, but is able to he drink. She reports she is eating about half of what she normally would. For lunch today she had cottage cheese, vegetable chips and yogurt.  She also was able to eat a quesadilla. She had the liver ultrasound this morning, but then went to work. Liver ultrasound revealed calcifications within the gallbladder and possible gallstones and a mild enlargement in the CBD. Given that we know she has active and acute hepatitis B, I do not think her gallbladder is playing a part in her LFT abnormalities. We can evaluate this further in the future, once her hepatitis B has resolved. We will continue to watch her closely and I have given her my cell number should she have any questions or concerns or feel that she is worsening over the weekend I'll repeat labs on Monday, CBC, hepatic function panel, basic metabolic panel, and INR If INR rises, I will refer her to [facility] for further evaluation and start tenofovir. I expect she will clear this infection on her own, given that most people with acute Hep B do so.  CMP     Component Value Date/Time   NA 138 11/25/2011 1500   K 3.7 11/25/2011 1500   CL 102 11/25/2011 1500   CO2 26 11/25/2011 1500   GLUCOSE 85 11/25/2011 1500   BUN 10 11/25/2011 1500   CREATININE 0.6 11/25/2011 1500   CREATININE 0.84 05/27/2011 1700   CALCIUM 9.3 11/25/2011 1500   PROT 7.0 11/28/2011 1532   ALBUMIN 3.8 11/28/2011 1532   AST 1567* 11/28/2011 1532   ALT 2793 Repeated and verified X2.* 11/28/2011 1532   ALKPHOS 265* 11/28/2011 1532   BILITOT 13.2*  11/28/2011 1532   GFRNONAA >90 11/23/2011 1403   GFRAA >90 11/23/2011 1403   INR 1.2  CBC    Component Value Date/Time   WBC 5.6 11/28/2011 1532   RBC 4.36 11/28/2011 1532   HGB 13.5 11/28/2011 1532   HCT 40.6 11/28/2011 1532   PLT 281.0 11/28/2011 1532   MCV 93.0 11/28/2011 1532   MCH 31.6 11/23/2011 1403   MCHC 33.3 11/28/2011 1532   RDW 16.4* 11/28/2011 1532   LYMPHSABS 1.4 11/28/2011 1532   MONOABS 0.9 11/28/2011 1532   EOSABS 0.1 11/28/2011 1532   BASOSABS 0.1 11/28/2011 1532

## 2011-12-02 ENCOUNTER — Other Ambulatory Visit (INDEPENDENT_AMBULATORY_CARE_PROVIDER_SITE_OTHER): Payer: 59

## 2011-12-02 ENCOUNTER — Telehealth: Payer: Self-pay | Admitting: *Deleted

## 2011-12-02 ENCOUNTER — Telehealth: Payer: Self-pay | Admitting: Internal Medicine

## 2011-12-02 DIAGNOSIS — B1910 Unspecified viral hepatitis B without hepatic coma: Secondary | ICD-10-CM

## 2011-12-02 DIAGNOSIS — B169 Acute hepatitis B without delta-agent and without hepatic coma: Secondary | ICD-10-CM

## 2011-12-02 LAB — BASIC METABOLIC PANEL
BUN: 11 mg/dL (ref 6–23)
Calcium: 9.4 mg/dL (ref 8.4–10.5)
GFR: 112.08 mL/min (ref >60.00)
Potassium: 4 mEq/L (ref 3.5–5.1)
Sodium: 137 mEq/L (ref 135–145)

## 2011-12-02 LAB — HEPATIC FUNCTION PANEL
ALT: 2075 U/L — ABNORMAL HIGH (ref 0–35)
AST: 1132 U/L — ABNORMAL HIGH (ref 0–37)
Albumin: 3.6 g/dL (ref 3.5–5.2)
Alkaline Phosphatase: 258 U/L — ABNORMAL HIGH (ref 39–117)
Total Protein: 6.8 g/dL (ref 6.0–8.3)

## 2011-12-02 LAB — CBC WITH DIFFERENTIAL/PLATELET
Eosinophils Absolute: 0.1 10*3/uL (ref 0.0–0.7)
Lymphs Abs: 1.4 10*3/uL (ref 0.7–4.0)
MCHC: 32.9 g/dL (ref 30.0–36.0)
MCV: 94.6 fl (ref 78.0–100.0)
Monocytes Absolute: 1.1 10*3/uL — ABNORMAL HIGH (ref 0.1–1.0)
Neutrophils Relative %: 52.4 % (ref 43.0–77.0)
Platelets: 258 10*3/uL (ref 150.0–400.0)

## 2011-12-02 NOTE — Telephone Encounter (Signed)
lmom for pt to come in as early as she can for her labs per Dr Rhea Belton.

## 2011-12-02 NOTE — Telephone Encounter (Signed)
Dr Rhea Belton called pt.

## 2011-12-02 NOTE — Telephone Encounter (Signed)
Spoke with patient by phone today. LFTs remain elevated, but improved. AST, ALT, and T. bili are all down somewhat INR is slightly better at 1.1 All the above is encouraging. She does feel slightly better today and feels that her urine is less dark. Nausea is still present but better. She continues to have fatigue We will check labs later this week, she is her mind she remains contagious and she should continue universal cautions and use barrier protection when sexually active

## 2011-12-05 ENCOUNTER — Other Ambulatory Visit (INDEPENDENT_AMBULATORY_CARE_PROVIDER_SITE_OTHER): Payer: 59

## 2011-12-05 ENCOUNTER — Other Ambulatory Visit: Payer: Self-pay | Admitting: Gastroenterology

## 2011-12-05 DIAGNOSIS — R7989 Other specified abnormal findings of blood chemistry: Secondary | ICD-10-CM

## 2011-12-05 LAB — HEPATIC FUNCTION PANEL
AST: 582 U/L — ABNORMAL HIGH (ref 0–37)
Alkaline Phosphatase: 243 U/L — ABNORMAL HIGH (ref 39–117)
Total Bilirubin: 7.1 mg/dL — ABNORMAL HIGH (ref 0.3–1.2)

## 2011-12-06 LAB — PROTIME-INR: Prothrombin Time: 11.6 s (ref 10.2–12.4)

## 2011-12-10 ENCOUNTER — Encounter: Payer: Self-pay | Admitting: Internal Medicine

## 2011-12-11 ENCOUNTER — Ambulatory Visit (INDEPENDENT_AMBULATORY_CARE_PROVIDER_SITE_OTHER): Payer: 59 | Admitting: Internal Medicine

## 2011-12-11 ENCOUNTER — Encounter: Payer: Self-pay | Admitting: Internal Medicine

## 2011-12-11 ENCOUNTER — Other Ambulatory Visit (INDEPENDENT_AMBULATORY_CARE_PROVIDER_SITE_OTHER): Payer: 59

## 2011-12-11 VITALS — BP 90/60 | HR 72 | Ht 62.25 in | Wt 146.1 lb

## 2011-12-11 DIAGNOSIS — B1910 Unspecified viral hepatitis B without hepatic coma: Secondary | ICD-10-CM

## 2011-12-11 DIAGNOSIS — R17 Unspecified jaundice: Secondary | ICD-10-CM

## 2011-12-11 DIAGNOSIS — R7989 Other specified abnormal findings of blood chemistry: Secondary | ICD-10-CM

## 2011-12-11 LAB — CBC
MCV: 92.5 fl (ref 78.0–100.0)
Platelets: 277 10*3/uL (ref 150.0–400.0)
RBC: 4.31 Mil/uL (ref 3.87–5.11)
WBC: 5.3 10*3/uL (ref 4.5–10.5)

## 2011-12-11 LAB — HEPATIC FUNCTION PANEL
AST: 251 U/L — ABNORMAL HIGH (ref 0–37)
Albumin: 3.7 g/dL (ref 3.5–5.2)
Alkaline Phosphatase: 205 U/L — ABNORMAL HIGH (ref 39–117)
Bilirubin, Direct: 1.8 mg/dL — ABNORMAL HIGH (ref 0.0–0.3)

## 2011-12-11 LAB — PROTIME-INR
INR: 1.1 ratio — ABNORMAL HIGH (ref 0.8–1.0)
Prothrombin Time: 11.6 s (ref 10.2–12.4)

## 2011-12-11 NOTE — Patient Instructions (Addendum)
Your physician has requested that you go to the basement for the following lab work before leaving today: CBC, INR, Hepatic function panel

## 2011-12-11 NOTE — Progress Notes (Signed)
Subjective:    Patient ID: Sheryl Howell, female    DOB: 02-26-1956, 56 y.o.   MRN: [id number]  HPI Sheryl Howell is a 56 yo female with PMH of anxiety, depression, and recent active acute hep B who is seen in followup. She has returned today with her son. She reports overall she feels much improved. Her fatigue is better, but her stamina is not back to normal. She is still feeling quite tired by the end of the day. Her nausea has resolved and she is able to eat what she desires. Her urine is no longer dark in her stools are not clay colored. She denies abdominal pain. No vomiting. No bleeding. No ascites or lower extremity edema. No itching. She continues to work. No fevers or chills  Review of Systems As per history of present illness, otherwise negative  Current Medications, Allergies, Past Medical History, Past Surgical History, Family History and Social History were reviewed in [employer] record.     Objective:   Physical Exam BP 90/60  Pulse 72  Ht 5' 2.25" (1.581 m)  Wt 146 lb 2 oz (66.282 kg)  BMI 26.51 kg/m2 Constitutional: Well-developed and well-nourished. No distress. HEENT: Normocephalic and atraumatic. Oropharynx is clear and moist. No oropharyngeal exudate. Conjunctivae are normal.  Much less scleral icterus Cardiovascular: Normal rate, regular rhythm and intact distal pulses. No M/R/G Pulmonary/chest: Effort normal and breath sounds normal. No wheezing, rales or rhonchi. Abdominal: Soft, nontender, nondistended. Bowel sounds active throughout. There are no masses palpable. No hepatosplenomegaly. Extremities: no clubbing, cyanosis, or edema Neurological: Alert and oriented to person place and time. No asterixis Skin: Skin is warm and dry. No rashes noted. Psychiatric: Normal mood and affect. Behavior is normal.  Lab trend Results for Sheryl Howell, Sheryl Howell (MRN [id number]) as of 12/11/2011 17:25  Ref. Range 11/25/2011 15:00 11/28/2011 15:32 12/02/2011 09:54  12/05/2011 16:53 12/11/2011 15:55  Alkaline Phosphatase Latest Range: 39-117 U/L 302 (H) 265 (H) 258 (H) 243 (H) 205 (H)  Albumin Latest Range: 3.5-5.2 g/dL 4.0 3.8 3.6 3.6 3.7  AST Latest Range: 0-37 U/L 1659 (H) 1567 (H) 1132 (H) 582 (H) 251 (H)  ALT Latest Range: 0-35 U/L 2775 (H) 2793 Repeated and verified X2. (H) 2075 (H) 1252 (H) 603 (H)  Total Protein Latest Range: 6.0-8.3 g/dL 7.3 7.0 6.8 6.8 6.9  Bilirubin, Direct Latest Range: 0.0-0.3 mg/dL 5.6 (H) 7.6 (H) 6.4 (H) 3.6 (H) 1.8 (H)  Total Bilirubin Latest Range: 0.3-1.2 mg/dL 9.2 (H) 78.2 (H) 95.6 (H) 7.1 (H) 3.8 (H)   INR 1.1 (peak 1.2)  CBC    Component Value Date/Time   WBC 5.3 12/11/2011 1555   RBC 4.31 12/11/2011 1555   HGB 13.5 12/11/2011 1555   HCT 39.9 12/11/2011 1555   PLT 277.0 12/11/2011 1555   MCV 92.5 12/11/2011 1555   MCH 31.6 11/23/2011 1403   MCHC 33.9 12/11/2011 1555   RDW 15.7* 12/11/2011 1555   LYMPHSABS 1.4 12/02/2011 0954   MONOABS 1.1* 12/02/2011 0954   EOSABS 0.1 12/02/2011 0954   BASOSABS 0.0 12/02/2011 0954       Assessment & Plan:  Sheryl Howell is a 56 yo female with PMH of anxiety, depression, and recent active acute hep B who is seen in followup  1. Acute hepatitis B -- her overall clinical condition is much improved and her labs are downtrending. Her liver remains very well compensated with no signs of decompensation. I think that she has successfully battling this infection without the  need for antiviral medication. It is very likely she will resolve this infection and convert her surface antibody to positive. I reminded her that she remains infectious until we can document seroconversion from hep B surface antigen to antibody.  We will recheck labs in about 2 weeks, and she will call us if questions arise or if she worsens in any way.  Return to be determined by labs and when necessary

## 2011-12-17 ENCOUNTER — Ambulatory Visit: Payer: 59 | Admitting: Psychiatry

## 2011-12-23 ENCOUNTER — Telehealth: Payer: Self-pay | Admitting: Internal Medicine

## 2011-12-23 DIAGNOSIS — B169 Acute hepatitis B without delta-agent and without hepatic coma: Secondary | ICD-10-CM

## 2011-12-23 NOTE — Telephone Encounter (Signed)
Yes, she can resume her previous dose of Effexor Awaiting repeat labs and we will notify her.

## 2011-12-23 NOTE — Telephone Encounter (Signed)
Spoke with patient gave her Dr. Lauro Franklin recommendations.

## 2011-12-23 NOTE — Telephone Encounter (Signed)
Patient was told to decrease her Effexor to 37.5 mg because of the hepatitis. She is asking if she can resume her Effexor 75 mg now. Please, advise.

## 2011-12-24 ENCOUNTER — Other Ambulatory Visit (INDEPENDENT_AMBULATORY_CARE_PROVIDER_SITE_OTHER): Payer: 59

## 2011-12-24 DIAGNOSIS — B169 Acute hepatitis B without delta-agent and without hepatic coma: Secondary | ICD-10-CM

## 2011-12-24 DIAGNOSIS — B1910 Unspecified viral hepatitis B without hepatic coma: Secondary | ICD-10-CM

## 2011-12-24 LAB — PROTIME-INR: INR: 1.1 ratio — ABNORMAL HIGH (ref 0.8–1.0)

## 2011-12-24 LAB — HEPATIC FUNCTION PANEL
Alkaline Phosphatase: 143 U/L — ABNORMAL HIGH (ref 39–117)
Bilirubin, Direct: 0.9 mg/dL — ABNORMAL HIGH (ref 0.0–0.3)
Total Protein: 7.2 g/dL (ref 6.0–8.3)

## 2011-12-25 ENCOUNTER — Other Ambulatory Visit: Payer: Self-pay | Admitting: *Deleted

## 2011-12-25 DIAGNOSIS — B179 Acute viral hepatitis, unspecified: Secondary | ICD-10-CM

## 2011-12-31 ENCOUNTER — Ambulatory Visit (INDEPENDENT_AMBULATORY_CARE_PROVIDER_SITE_OTHER): Payer: 59 | Admitting: Psychiatry

## 2011-12-31 DIAGNOSIS — F4323 Adjustment disorder with mixed anxiety and depressed mood: Secondary | ICD-10-CM

## 2012-01-13 ENCOUNTER — Telehealth: Payer: Self-pay | Admitting: *Deleted

## 2012-01-13 ENCOUNTER — Other Ambulatory Visit: Payer: Self-pay | Admitting: *Deleted

## 2012-01-13 DIAGNOSIS — B1910 Unspecified viral hepatitis B without hepatic coma: Secondary | ICD-10-CM

## 2012-01-13 NOTE — Telephone Encounter (Signed)
Cbc, hep function, INR only please, you can cancel the [place] (not sure who ordered that test)

## 2012-01-13 NOTE — Telephone Encounter (Signed)
Message copied by Florene Glen on Mon Jan 13, 2012  8:34 AM ------      Message from: Daphine Deutscher      Created: Wed Dec 25, 2011  4:54 PM       I cannot remember if I sent you a reminder to call patient for 3 week lab recheck on 01/15/12.      Rene Kocher

## 2012-01-13 NOTE — Telephone Encounter (Signed)
Correct orders in system

## 2012-01-13 NOTE — Telephone Encounter (Signed)
Informed pt she needs to come in this week to repeat her labs; pt stated understanding. Dr Rhea Belton, pt has several outstanding tests ordered; do we need to repeat anything other than Hep Panel and Protime? Also there is a U/S Transabdominal Amnioinfusion that is outstanding; should I cancel this? Thanks.

## 2012-01-14 ENCOUNTER — Other Ambulatory Visit (INDEPENDENT_AMBULATORY_CARE_PROVIDER_SITE_OTHER): Payer: 59

## 2012-01-14 DIAGNOSIS — K7200 Acute and subacute hepatic failure without coma: Secondary | ICD-10-CM

## 2012-01-14 DIAGNOSIS — B1910 Unspecified viral hepatitis B without hepatic coma: Secondary | ICD-10-CM

## 2012-01-14 DIAGNOSIS — B179 Acute viral hepatitis, unspecified: Secondary | ICD-10-CM

## 2012-01-14 LAB — CBC WITH DIFFERENTIAL/PLATELET
Basophils Relative: 0.6 % (ref 0.0–3.0)
Eosinophils Relative: 1.4 % (ref 0.0–5.0)
Hemoglobin: 13.9 g/dL (ref 12.0–15.0)
Lymphocytes Relative: 35.9 % (ref 12.0–46.0)
MCHC: 34.6 g/dL (ref 30.0–36.0)
MCV: 91.6 fl (ref 78.0–100.0)
Monocytes Absolute: 0.7 10*3/uL (ref 0.1–1.0)
Neutro Abs: 3.2 10*3/uL (ref 1.4–7.7)
Neutrophils Relative %: 51.4 % (ref 43.0–77.0)
RBC: 4.37 Mil/uL (ref 3.87–5.11)
WBC: 6.2 10*3/uL (ref 4.5–10.5)

## 2012-01-14 LAB — HEPATIC FUNCTION PANEL
AST: 40 U/L — ABNORMAL HIGH (ref 0–37)
Albumin: 3.8 g/dL (ref 3.5–5.2)
Alkaline Phosphatase: 102 U/L (ref 39–117)
Bilirubin, Direct: 0.4 mg/dL — ABNORMAL HIGH (ref 0.0–0.3)
Total Bilirubin: 1.1 mg/dL (ref 0.3–1.2)

## 2012-01-15 LAB — HEPATITIS B SURFACE ANTIBODY,QUALITATIVE: Hep B S Ab: NEGATIVE

## 2012-01-16 LAB — HEPATITIS B DNA, ULTRAQUANTITATIVE, PCR: Hepatitis B DNA (Calc): <116 copies/mL (ref <116)

## 2012-01-28 ENCOUNTER — Ambulatory Visit (INDEPENDENT_AMBULATORY_CARE_PROVIDER_SITE_OTHER): Payer: 59 | Admitting: Psychiatry

## 2012-01-28 DIAGNOSIS — F4323 Adjustment disorder with mixed anxiety and depressed mood: Secondary | ICD-10-CM

## 2012-02-17 ENCOUNTER — Telehealth: Payer: Self-pay | Admitting: *Deleted

## 2012-02-17 DIAGNOSIS — B1910 Unspecified viral hepatitis B without hepatic coma: Secondary | ICD-10-CM

## 2012-02-17 NOTE — Telephone Encounter (Signed)
Informed pt she needs to come in to repeat labs; pt stated understanding.

## 2012-02-17 NOTE — Telephone Encounter (Signed)
Message copied by Florene Glen on Mon Feb 17, 2012  2:42 PM ------      Message from: Graciella Freer K      Created: Thu Jan 16, 2012  4:19 PM       Call in 1 month to repeat Hep panel, Hep B surface antigen and antibody

## 2012-02-18 ENCOUNTER — Other Ambulatory Visit (INDEPENDENT_AMBULATORY_CARE_PROVIDER_SITE_OTHER): Payer: 59

## 2012-02-18 DIAGNOSIS — B1910 Unspecified viral hepatitis B without hepatic coma: Secondary | ICD-10-CM

## 2012-02-18 LAB — HEPATIC FUNCTION PANEL
Alkaline Phosphatase: 100 U/L (ref 39–117)
Bilirubin, Direct: 0.2 mg/dL (ref 0.0–0.3)
Total Bilirubin: 0.9 mg/dL (ref 0.3–1.2)

## 2012-02-19 LAB — HEPATITIS B SURFACE ANTIBODY,QUALITATIVE: Hep B S Ab: NONREACTIVE

## 2012-02-24 ENCOUNTER — Telehealth: Payer: Self-pay | Admitting: Internal Medicine

## 2012-02-24 DIAGNOSIS — N3289 Other specified disorders of bladder: Secondary | ICD-10-CM

## 2012-02-24 DIAGNOSIS — K8020 Calculus of gallbladder without cholecystitis without obstruction: Secondary | ICD-10-CM

## 2012-02-24 DIAGNOSIS — K838 Other specified diseases of biliary tract: Secondary | ICD-10-CM

## 2012-02-24 DIAGNOSIS — B1910 Unspecified viral hepatitis B without hepatic coma: Secondary | ICD-10-CM

## 2012-02-25 ENCOUNTER — Ambulatory Visit (INDEPENDENT_AMBULATORY_CARE_PROVIDER_SITE_OTHER): Payer: 59 | Admitting: Psychiatry

## 2012-02-25 DIAGNOSIS — F4323 Adjustment disorder with mixed anxiety and depressed mood: Secondary | ICD-10-CM

## 2012-02-25 NOTE — Telephone Encounter (Signed)
Informed pt of her U/S appt and prep: 03/09/12 at [facility], arrive at 0815am, NPO after midnight; pt stated understanding.

## 2012-02-25 NOTE — Telephone Encounter (Signed)
Dr Rhea Belton, informed pt of her labs results; orders in for 8 weeks with a reminder to call pt. Pt stated understanding, but wants to know about further work up on Gall Bladder problems per 11/29/11 CT. Please advise. Thanks.

## 2012-02-25 NOTE — Telephone Encounter (Signed)
Notes Recorded by Beverley Fiedler, MD on 02/24/2012 at 5:20 PM Liver enzymes are slightly better still. AST and ALT remain very slightly elevated Her hepatitis B surface antigen is no longer positive, which is a good sign. Surface antibody not yet positive but likely will become positive (which one would expect after resolution of infection) I recommend repeating the same labs in about 8 weeks As an aside - her son no showed his last appointment and needs to followup lmom for pt to call back.

## 2012-02-25 NOTE — Telephone Encounter (Signed)
Let's repeat the US - gallbladder only for further evaluation.  Maybe this will give clearer picture on repeat. May need CT, but would repeat gallbladder US 1st

## 2012-02-25 NOTE — Telephone Encounter (Signed)
lmom for pt to call back, 

## 2012-03-09 ENCOUNTER — Telehealth: Payer: Self-pay

## 2012-03-09 ENCOUNTER — Other Ambulatory Visit: Payer: Self-pay | Admitting: Internal Medicine

## 2012-03-09 ENCOUNTER — Ambulatory Visit (HOSPITAL_COMMUNITY)
Admission: RE | Admit: 2012-03-09 | Discharge: 2012-03-09 | Disposition: A | Discharge status: Home / Self Care | Payer: 59 | Source: Ambulatory Visit | Attending: Internal Medicine | Admitting: Internal Medicine

## 2012-03-09 DIAGNOSIS — K6389 Other specified diseases of intestine: Secondary | ICD-10-CM | POA: Insufficient documentation

## 2012-03-09 DIAGNOSIS — K8020 Calculus of gallbladder without cholecystitis without obstruction: Secondary | ICD-10-CM | POA: Insufficient documentation

## 2012-03-09 DIAGNOSIS — K838 Other specified diseases of biliary tract: Secondary | ICD-10-CM | POA: Insufficient documentation

## 2012-03-09 DIAGNOSIS — N3289 Other specified disorders of bladder: Secondary | ICD-10-CM

## 2012-03-09 DIAGNOSIS — K7689 Other specified diseases of liver: Secondary | ICD-10-CM | POA: Insufficient documentation

## 2012-03-09 DIAGNOSIS — B1910 Unspecified viral hepatitis B without hepatic coma: Secondary | ICD-10-CM | POA: Insufficient documentation

## 2012-03-09 NOTE — Telephone Encounter (Signed)
Spoke with pt and she is aware of appt date and time. 

## 2012-03-09 NOTE — Telephone Encounter (Signed)
Message copied by Chrystie Nose on Mon Mar 09, 2012  4:04 PM ------      Message from: Marnette Burgess      Created: Mon Mar 09, 2012  3:48 PM      Regarding: RE: Referral       Patient is scheduled to see Dr. Harriette Bouillon on 03/23/12 @ 10:10am, arrive @ 9:40am.  If you have any questions please call [phone].            Thank You,      Elane Fritz      ----- Message -----         From: Lily Lovings, RN         Sent: 03/09/2012   3:30 PM           To: Marnette Burgess      Subject: Referral                                                 Elane Fritz,            Dr. Rhea Belton would like this pt seen for gallbladder surgery. Please let me know appt date and time.                  Thanks,      Selinda Michaels

## 2012-03-23 ENCOUNTER — Ambulatory Visit (INDEPENDENT_AMBULATORY_CARE_PROVIDER_SITE_OTHER): Payer: 59 | Admitting: Surgery

## 2012-03-24 ENCOUNTER — Ambulatory Visit (INDEPENDENT_AMBULATORY_CARE_PROVIDER_SITE_OTHER): Payer: BC Managed Care – PPO | Admitting: Psychiatry

## 2012-03-24 DIAGNOSIS — F0630 Mood disorder due to known physiological condition, unspecified: Secondary | ICD-10-CM

## 2012-03-24 DIAGNOSIS — F4323 Adjustment disorder with mixed anxiety and depressed mood: Secondary | ICD-10-CM

## 2012-03-30 ENCOUNTER — Ambulatory Visit (INDEPENDENT_AMBULATORY_CARE_PROVIDER_SITE_OTHER): Payer: 59 | Admitting: Surgery

## 2012-04-05 ENCOUNTER — Emergency Department (HOSPITAL_COMMUNITY)
Admission: EM | Admit: 2012-04-05 | Discharge: 2012-04-05 | Disposition: A | Discharge status: Home / Self Care | Payer: BC Managed Care – PPO | Attending: Emergency Medicine | Admitting: Emergency Medicine

## 2012-04-05 ENCOUNTER — Emergency Department (HOSPITAL_COMMUNITY): Payer: BC Managed Care – PPO

## 2012-04-05 ENCOUNTER — Encounter (HOSPITAL_COMMUNITY): Payer: Self-pay | Admitting: Emergency Medicine

## 2012-04-05 DIAGNOSIS — Z8669 Personal history of other diseases of the nervous system and sense organs: Secondary | ICD-10-CM | POA: Insufficient documentation

## 2012-04-05 DIAGNOSIS — K8050 Calculus of bile duct without cholangitis or cholecystitis without obstruction: Secondary | ICD-10-CM

## 2012-04-05 DIAGNOSIS — F411 Generalized anxiety disorder: Secondary | ICD-10-CM | POA: Insufficient documentation

## 2012-04-05 DIAGNOSIS — K8020 Calculus of gallbladder without cholecystitis without obstruction: Secondary | ICD-10-CM | POA: Insufficient documentation

## 2012-04-05 DIAGNOSIS — F3289 Other specified depressive episodes: Secondary | ICD-10-CM | POA: Insufficient documentation

## 2012-04-05 DIAGNOSIS — Z8619 Personal history of other infectious and parasitic diseases: Secondary | ICD-10-CM | POA: Insufficient documentation

## 2012-04-05 DIAGNOSIS — F329 Major depressive disorder, single episode, unspecified: Secondary | ICD-10-CM | POA: Insufficient documentation

## 2012-04-05 DIAGNOSIS — Z79899 Other long term (current) drug therapy: Secondary | ICD-10-CM | POA: Insufficient documentation

## 2012-04-05 LAB — URINALYSIS, ROUTINE W REFLEX MICROSCOPIC
Bilirubin Urine: NEGATIVE
Glucose, UA: NEGATIVE mg/dL
Hgb urine dipstick: NEGATIVE
Ketones, ur: NEGATIVE mg/dL
Leukocytes, UA: NEGATIVE
Nitrite: NEGATIVE
Protein, ur: NEGATIVE mg/dL
Specific Gravity, Urine: 1.025 (ref 1.005–1.030)
Urobilinogen, UA: 1 mg/dL (ref 0.0–1.0)
pH: 6.5 (ref 5.0–8.0)

## 2012-04-05 LAB — COMPREHENSIVE METABOLIC PANEL
ALT: 27 U/L (ref 0–35)
Alkaline Phosphatase: 99 U/L (ref 39–117)
CO2: 26 mEq/L (ref 19–32)
Chloride: 99 mEq/L (ref 96–112)
GFR calc Af Amer: >90 mL/min (ref >90)
GFR calc non Af Amer: >90 mL/min (ref >90)
Glucose, Bld: 135 mg/dL — ABNORMAL HIGH (ref 70–99)
Potassium: 3.7 mEq/L (ref 3.5–5.1)
Sodium: 135 mEq/L (ref 135–145)
Total Bilirubin: 0.4 mg/dL (ref 0.3–1.2)

## 2012-04-05 LAB — CBC WITH DIFFERENTIAL/PLATELET
Basophils Absolute: 0 10*3/uL (ref 0.0–0.1)
Basophils Relative: 0 % (ref 0–1)
Eosinophils Absolute: 0 10*3/uL (ref 0.0–0.7)
Eosinophils Relative: 0 % (ref 0–5)
HCT: 41.3 % (ref 36.0–46.0)
Hemoglobin: 14.6 g/dL (ref 12.0–15.0)
Lymphocytes Relative: 7 % — ABNORMAL LOW (ref 12–46)
Lymphs Abs: 0.6 10*3/uL — ABNORMAL LOW (ref 0.7–4.0)
MCH: 31.7 pg (ref 26.0–34.0)
MCHC: 35.4 g/dL (ref 30.0–36.0)
MCV: 89.6 fL (ref 78.0–100.0)
Monocytes Absolute: 0.2 10*3/uL (ref 0.1–1.0)
Monocytes Relative: 3 % (ref 3–12)
Neutro Abs: 7.9 10*3/uL — ABNORMAL HIGH (ref 1.7–7.7)
Neutrophils Relative %: 90 % — ABNORMAL HIGH (ref 43–77)
Platelets: 231 10*3/uL (ref 150–400)
RBC: 4.61 MIL/uL (ref 3.87–5.11)
RDW: 11.7 % (ref 11.5–15.5)
WBC: 8.8 10*3/uL (ref 4.0–10.5)

## 2012-04-05 MED ORDER — ONDANSETRON 8 MG PO TBDP: 8.0000 mg | ORAL_TABLET | Freq: Three times a day (TID) | ORAL | Status: DC | PRN

## 2012-04-05 MED ORDER — OXYCODONE-ACETAMINOPHEN 5-325 MG PO TABS: 1.0000 | ORAL_TABLET | ORAL | Status: DC | PRN

## 2012-04-05 MED ORDER — SODIUM CHLORIDE 0.9 % IV SOLN: 1000.0000 mL | INTRAVENOUS | Status: DC

## 2012-04-05 MED ORDER — SODIUM CHLORIDE 0.9 % IV SOLN
1000.0000 mL | Freq: Once | INTRAVENOUS | Status: AC
  Administered 2012-04-05: 1000 mL via INTRAVENOUS

## 2012-04-05 MED ORDER — MORPHINE SULFATE 4 MG/ML IJ SOLN
6.0000 mg | Freq: Once | INTRAMUSCULAR | Status: AC
  Administered 2012-04-05: 6 mg via INTRAVENOUS
  Filled 2012-04-05: qty 2

## 2012-04-05 MED ORDER — ONDANSETRON HCL 4 MG/2ML IJ SOLN
4.0000 mg | Freq: Once | INTRAMUSCULAR | Status: AC
  Administered 2012-04-05: 4 mg via INTRAVENOUS
  Filled 2012-04-05: qty 2

## 2012-04-05 NOTE — ED Notes (Signed)
Discharge instructions reviewed w/ pt., verbalizes understanding. Two prescriptions provided at discharge. 

## 2012-04-05 NOTE — ED Provider Notes (Signed)
History     CSN: [id number]  Arrival date & time 04/05/12  1130   First MD Initiated Contact with Patient 04/05/12 1227      Chief Complaint  Patient presents with  . Abdominal Pain     The history is provided by the patient.   patient reports developing acute onset epigastric pain at approximately 4 in the morning this morning.  Pain is been constant since then.  She's had nausea without vomiting.  She denies diarrhea.  No fevers but did have some chills through the night.  Decreased oral intake today.  She has a known history of cholelithiasis.  She is scheduled followup with the general surgeon for consultation and 8 days.  She states this pain is similar to her pain which ultimately led to her diagnosis of cholelithiasis.  No back pain.  No radiation of her pain.  Her pain is mild to moderate in severity.  Nothing worsens or improves her pain.    Past Medical History  Diagnosis Date  . Hepatitis B   . Anxiety   . Depression   . Seizures   . Hypotension     Past Surgical History  Procedure Date  . Knee surgery   . Wrist surgery   . Foot surgery     Family History  Problem Relation Age of Onset  . Heart disease      History  Substance Use Topics  . Smoking status: Never Smoker   . Smokeless tobacco: Never Used  . Alcohol Use: No     Comment: rarely    OB History    Grav Para Term Preterm Abortions TAB SAB Ect Mult Living                  Review of Systems  All other systems reviewed and are negative.    Allergies  Sulfa antibiotics  Home Medications   Current Outpatient Rx  Name  Route  Sig  Dispense  Refill  . DICLOFENAC SODIUM 1 % TD GEL   Topical   Apply 2 g topically 4 (four) times daily as needed. For pain.         Marland Kitchen OMEGA-3 FATTY ACIDS 1000 MG PO CAPS   Oral   Take 1 g by mouth daily.         . ADULT MULTIVITAMIN W/MINERALS CH   Oral   Take 1 tablet by mouth daily.         . VENLAFAXINE HCL ER 75 MG PO CP24   Oral   Take 75  mg by mouth daily.         Marland Kitchen ONDANSETRON 8 MG PO TBDP   Oral   Take 1 tablet (8 mg total) by mouth every 8 (eight) hours as needed for nausea.   12 tablet   0   . OXYCODONE-ACETAMINOPHEN 5-325 MG PO TABS   Oral   Take 1 tablet by mouth every 4 (four) hours as needed for pain.   20 tablet   0     BP 127/85  Pulse 63  Temp 98.5 F (36.9 C)  Resp 20  SpO2 97%  Physical Exam  Nursing note and vitals reviewed. Constitutional: She is oriented to person, place, and time. She appears well-developed and well-nourished. No distress.  HENT:  Head: Normocephalic and atraumatic.  Eyes: EOM are normal.  Neck: Normal range of motion.  Cardiovascular: Normal rate, regular rhythm and normal heart sounds.   Pulmonary/Chest: Effort normal and  breath sounds normal.  Abdominal: Soft. She exhibits no distension.       Mild epigastric tenderness without guarding or rebound.  No Murphy sign  Musculoskeletal: Normal range of motion.  Neurological: She is alert and oriented to person, place, and time.  Skin: Skin is warm and dry.  Psychiatric: She has a normal mood and affect. Judgment normal.    ED Course  Procedures (including critical care time)  Labs Reviewed  CBC WITH DIFFERENTIAL - Abnormal; Notable for the following:    Neutrophils Relative 90 (*)     Neutro Abs 7.9 (*)     Lymphocytes Relative 7 (*)     Lymphs Abs 0.6 (*)     All other components within normal limits  COMPREHENSIVE METABOLIC PANEL - Abnormal; Notable for the following:    Glucose, Bld 135 (*)     All other components within normal limits  URINALYSIS, ROUTINE W REFLEX MICROSCOPIC  LIPASE, BLOOD   US Abdomen Complete  04/05/2012  *RADIOLOGY REPORT*  Clinical Data:  Abdominal pain  ABDOMINAL ULTRASOUND COMPLETE  Comparison:  Abdominal ultrasound 03/09/2012  Findings:  Gallbladder:  There are innumerable small stones collected within the dependent portion of the gallbladder resulting in posterior acoustic  shadowing.  Gallbladder appears mildly distended. Gallbladder wall thickness is normal (2.8 mm). Sonographic Murphy's sign is negative.  Common Bile Duct:  Within normal limits in caliber. Measures 5.4 mm where visualized.  Liver: No focal mass lesion identified.  Within normal limits in parenchymal echogenicity.  IVC:  Appears normal.  Pancreas:  Although the pancreas is difficult to visualize in its entirety, no focal pancreatic abnormality is identified.  Spleen:  Within normal limits in size and echotexture. Measures 5.7 cm in sagittal length.  Right kidney:  Normal in size and parenchymal echogenicity.  No evidence of mass or hydronephrosis.  Left kidney:  Normal in size and parenchymal echogenicity.  No evidence of mass or hydronephrosis.  Abdominal Aorta:  No aneurysm identified. Atherosclerotic irregularity of the abdominal aorta is noted.  No evidence of aneurysm.  IMPRESSION: Cholelithiasis.   Original Report Authenticated By: Britta Mccreedy, M.D.    I personally reviewed the imaging tests through PACS system I reviewed available ER/hospitalization records through the EMR   1. Biliary colic       MDM  3:31 PM The patient feels much better this time.  This is likely biliary colic.  Lipase and LFTs are normal.  White count is normal.  Vital signs are normal.  Ultrasound demonstrates cholelithiasis without signs of cholecystitis.  Schedule general surgery followup.  She will call the office this week for sooner followup.  She understands return to the ER for new or worsening symptoms        Lyanne Co, MD 04/05/12 [phone]

## 2012-04-05 NOTE — ED Notes (Signed)
Pt presenting to ed with c/o abdominal pain with positive nausea. Pt denies vomiting at this time. Pt denies diarrhea at this time. Pt states " I think I might be having a gallbladder attack.

## 2012-04-07 ENCOUNTER — Inpatient Hospital Stay (HOSPITAL_COMMUNITY)
Admission: EM | Admit: 2012-04-07 | Discharge: 2012-04-10 | DRG: 493 | Disposition: A | Discharge status: Home / Self Care | Payer: BC Managed Care – PPO | Attending: General Surgery | Admitting: General Surgery

## 2012-04-07 ENCOUNTER — Encounter (HOSPITAL_COMMUNITY): Payer: Self-pay | Admitting: Emergency Medicine

## 2012-04-07 ENCOUNTER — Encounter (HOSPITAL_BASED_OUTPATIENT_CLINIC_OR_DEPARTMENT_OTHER): Payer: Self-pay | Admitting: *Deleted

## 2012-04-07 ENCOUNTER — Encounter (INDEPENDENT_AMBULATORY_CARE_PROVIDER_SITE_OTHER): Payer: Self-pay | Admitting: Surgery

## 2012-04-07 ENCOUNTER — Ambulatory Visit (INDEPENDENT_AMBULATORY_CARE_PROVIDER_SITE_OTHER): Payer: BC Managed Care – PPO | Admitting: Surgery

## 2012-04-07 VITALS — BP 110/72 | HR 68 | Temp 98.7°F | Resp 16 | Ht 62.38 in | Wt 149.4 lb

## 2012-04-07 DIAGNOSIS — K8010 Calculus of gallbladder with chronic cholecystitis without obstruction: Secondary | ICD-10-CM

## 2012-04-07 DIAGNOSIS — R17 Unspecified jaundice: Secondary | ICD-10-CM | POA: Diagnosis not present

## 2012-04-07 DIAGNOSIS — B169 Acute hepatitis B without delta-agent and without hepatic coma: Secondary | ICD-10-CM

## 2012-04-07 DIAGNOSIS — Z8669 Personal history of other diseases of the nervous system and sense organs: Secondary | ICD-10-CM

## 2012-04-07 DIAGNOSIS — K8020 Calculus of gallbladder without cholecystitis without obstruction: Secondary | ICD-10-CM

## 2012-04-07 DIAGNOSIS — Z8659 Personal history of other mental and behavioral disorders: Secondary | ICD-10-CM

## 2012-04-07 DIAGNOSIS — Z8619 Personal history of other infectious and parasitic diseases: Secondary | ICD-10-CM | POA: Diagnosis present

## 2012-04-07 DIAGNOSIS — K8050 Calculus of bile duct without cholangitis or cholecystitis without obstruction: Secondary | ICD-10-CM

## 2012-04-07 DIAGNOSIS — K819 Cholecystitis, unspecified: Secondary | ICD-10-CM

## 2012-04-07 DIAGNOSIS — G40909 Epilepsy, unspecified, not intractable, without status epilepticus: Secondary | ICD-10-CM | POA: Diagnosis present

## 2012-04-07 DIAGNOSIS — D649 Anemia, unspecified: Secondary | ICD-10-CM | POA: Diagnosis present

## 2012-04-07 DIAGNOSIS — F3289 Other specified depressive episodes: Secondary | ICD-10-CM | POA: Diagnosis present

## 2012-04-07 DIAGNOSIS — K8063 Calculus of gallbladder and bile duct with acute cholecystitis with obstruction: Principal | ICD-10-CM | POA: Diagnosis present

## 2012-04-07 DIAGNOSIS — K831 Obstruction of bile duct: Secondary | ICD-10-CM | POA: Clinically undetermined

## 2012-04-07 DIAGNOSIS — R748 Abnormal levels of other serum enzymes: Secondary | ICD-10-CM | POA: Diagnosis present

## 2012-04-07 DIAGNOSIS — F411 Generalized anxiety disorder: Secondary | ICD-10-CM | POA: Diagnosis present

## 2012-04-07 DIAGNOSIS — F329 Major depressive disorder, single episode, unspecified: Secondary | ICD-10-CM | POA: Diagnosis present

## 2012-04-07 DIAGNOSIS — B1910 Unspecified viral hepatitis B without hepatic coma: Secondary | ICD-10-CM | POA: Diagnosis present

## 2012-04-07 DIAGNOSIS — Z882 Allergy status to sulfonamides status: Secondary | ICD-10-CM

## 2012-04-07 DIAGNOSIS — K8001 Calculus of gallbladder with acute cholecystitis with obstruction: Secondary | ICD-10-CM | POA: Diagnosis present

## 2012-04-07 HISTORY — DX: Calculus of gallbladder with acute cholecystitis with obstruction: K80.01

## 2012-04-07 HISTORY — DX: Personal history of other mental and behavioral disorders: Z86.59

## 2012-04-07 HISTORY — DX: Personal history of other diseases of the nervous system and sense organs: Z86.69

## 2012-04-07 HISTORY — DX: Personal history of other infectious and parasitic diseases: Z86.19

## 2012-04-07 LAB — CBC WITH DIFFERENTIAL/PLATELET
Basophils Absolute: 0 10*3/uL (ref 0.0–0.1)
Basophils Relative: 0 % (ref 0–1)
Eosinophils Relative: 0 % (ref 0–5)
HCT: 41.4 % (ref 36.0–46.0)
MCHC: 35.7 g/dL (ref 30.0–36.0)
MCV: 90.4 fL (ref 78.0–100.0)
Monocytes Absolute: 0.7 10*3/uL (ref 0.1–1.0)
Neutro Abs: 6.7 10*3/uL (ref 1.7–7.7)
RDW: 12.1 % (ref 11.5–15.5)

## 2012-04-07 LAB — COMPREHENSIVE METABOLIC PANEL
AST: 209 U/L — ABNORMAL HIGH (ref 0–37)
Albumin: 3.9 g/dL (ref 3.5–5.2)
Calcium: 9.5 mg/dL (ref 8.4–10.5)
Creatinine, Ser: 0.72 mg/dL (ref 0.50–1.10)

## 2012-04-07 LAB — SURGICAL PCR SCREEN: MRSA, PCR: NEGATIVE

## 2012-04-07 MED ORDER — PANTOPRAZOLE SODIUM 40 MG IV SOLR
40.0000 mg | Freq: Every day | INTRAVENOUS | Status: DC
  Administered 2012-04-07: 40 mg via INTRAVENOUS
  Filled 2012-04-07 (×2): qty 40

## 2012-04-07 MED ORDER — DEXTROSE 5 % IV SOLN
1.0000 g | Freq: Four times a day (QID) | INTRAVENOUS | Status: DC
  Administered 2012-04-07 – 2012-04-08 (×4): 1 g via INTRAVENOUS
  Filled 2012-04-07 (×6): qty 1

## 2012-04-07 MED ORDER — ONDANSETRON HCL 4 MG/2ML IJ SOLN
4.0000 mg | Freq: Once | INTRAMUSCULAR | Status: AC
  Administered 2012-04-07: 4 mg via INTRAVENOUS
  Filled 2012-04-07: qty 2

## 2012-04-07 MED ORDER — HYDROMORPHONE HCL PF 1 MG/ML IJ SOLN
0.2500 mg | INTRAMUSCULAR | Status: DC | PRN
  Administered 2012-04-07 – 2012-04-08 (×7): 0.25 mg via INTRAVENOUS
  Filled 2012-04-07 (×7): qty 1

## 2012-04-07 MED ORDER — ACETAMINOPHEN 650 MG RE SUPP: 650.0000 mg | Freq: Four times a day (QID) | RECTAL | Status: DC | PRN

## 2012-04-07 MED ORDER — HYDROMORPHONE HCL PF 1 MG/ML IJ SOLN
1.0000 mg | Freq: Once | INTRAMUSCULAR | Status: AC
  Administered 2012-04-07: 1 mg via INTRAVENOUS
  Filled 2012-04-07: qty 1

## 2012-04-07 MED ORDER — ONDANSETRON HCL 4 MG/2ML IJ SOLN: 4.0000 mg | Freq: Four times a day (QID) | INTRAMUSCULAR | Status: DC | PRN

## 2012-04-07 MED ORDER — OXYCODONE HCL 5 MG PO TABS: 5.0000 mg | ORAL_TABLET | ORAL | Status: DC | PRN

## 2012-04-07 MED ORDER — SODIUM CHLORIDE 0.9 % IV SOLN
INTRAVENOUS | Status: DC
  Administered 2012-04-07: 16:00:00 via INTRAVENOUS

## 2012-04-07 MED ORDER — SODIUM CHLORIDE 0.9 % IV SOLN
INTRAVENOUS | Status: DC
  Administered 2012-04-07 – 2012-04-08 (×2): via INTRAVENOUS

## 2012-04-07 MED ORDER — ACETAMINOPHEN 325 MG PO TABS: 650.0000 mg | ORAL_TABLET | Freq: Four times a day (QID) | ORAL | Status: DC | PRN

## 2012-04-07 MED ORDER — MORPHINE SULFATE 2 MG/ML IJ SOLN
2.0000 mg | INTRAMUSCULAR | Status: DC | PRN
  Administered 2012-04-07: 2 mg via INTRAVENOUS
  Filled 2012-04-07: qty 1

## 2012-04-07 NOTE — ED Provider Notes (Addendum)
History     CSN: [id number]  Arrival date & time 04/07/12  1518   First MD Initiated Contact with Patient 04/07/12 1540      Chief Complaint  Patient presents with  . Cholelithiasis    (Consider location/radiation/quality/duration/timing/severity/associated sxs/prior treatment) The history is provided by the patient.   patient here with epigastric pain that began today after she saw her general surgeon for preoperative visit for a cholecystectomy on Friday. No fever or vomiting. No change in her stools. Seen here 2 days ago for similar symptoms diagnosed with cholelithiasis. Was instructed to return here if her pain worsened. She took Percocet without relief of her symptoms.  Past Medical History  Diagnosis Date  . Anxiety   . Depression   . Seizures   . Hypotension   . Anemia   . PONV (postoperative nausea and vomiting)   . Adverse effect of anesthetic     bp low-takes awhile to come up post op  . Nausea   . Hepatitis B     9/13-dx hep b    Past Surgical History  Procedure Date  . Knee surgery   . Wrist surgery   . Foot surgery   . Diagnostic laparoscopy 1992    exp lap fertility  . Bunionectomy     both feet    Family History  Problem Relation Age of Onset  . Heart disease    . Cancer Sister     Uterine  . Cancer Maternal Uncle   . Cancer Paternal Aunt     Breast    History  Substance Use Topics  . Smoking status: Never Smoker   . Smokeless tobacco: Never Used  . Alcohol Use: Yes     Comment: rarely    OB History    Grav Para Term Preterm Abortions TAB SAB Ect Mult Living                  Review of Systems  All other systems reviewed and are negative.    Allergies  Sulfa antibiotics  Home Medications   Current Outpatient Rx  Name  Route  Sig  Dispense  Refill  . DICLOFENAC SODIUM 1 % TD GEL   Topical   Apply 2 g topically 4 (four) times daily as needed. For pain.         Marland Kitchen OMEGA-3 FATTY ACIDS 1000 MG PO CAPS   Oral   Take 1 g  by mouth daily.         . ADULT MULTIVITAMIN W/MINERALS CH   Oral   Take 1 tablet by mouth daily.         Marland Kitchen ONDANSETRON 8 MG PO TBDP   Oral   Take 1 tablet (8 mg total) by mouth every 8 (eight) hours as needed for nausea.   12 tablet   0   . OXYCODONE-ACETAMINOPHEN 5-325 MG PO TABS   Oral   Take 1 tablet by mouth every 4 (four) hours as needed. Pain         . VENLAFAXINE HCL ER 75 MG PO CP24   Oral   Take 75 mg by mouth daily.           BP 123/71  Pulse 55  Temp 98.5 F (36.9 C) (Oral)  Resp 18  SpO2 95%  Physical Exam  Nursing note and vitals reviewed. Constitutional: She is oriented to person, place, and time. She appears well-developed and well-nourished.  Non-toxic appearance. No distress.  HENT:  Head: Normocephalic and atraumatic.  Eyes: Conjunctivae normal, EOM and lids are normal. Pupils are equal, round, and reactive to light.  Neck: Normal range of motion. Neck supple. No tracheal deviation present. No mass present.  Cardiovascular: Normal rate, regular rhythm and normal heart sounds.  Exam reveals no gallop.   No murmur heard. Pulmonary/Chest: Effort normal and breath sounds normal. No stridor. No respiratory distress. She has no decreased breath sounds. She has no wheezes. She has no rhonchi. She has no rales.  Abdominal: Soft. Normal appearance and bowel sounds are normal. She exhibits no distension. There is tenderness in the epigastric area. There is no rigidity, no rebound, no guarding and no CVA tenderness.    Musculoskeletal: Normal range of motion. She exhibits no edema and no tenderness.  Neurological: She is alert and oriented to person, place, and time. She has normal strength. No cranial nerve deficit or sensory deficit. GCS eye subscore is 4. GCS verbal subscore is 5. GCS motor subscore is 6.  Skin: Skin is warm and dry. No abrasion and no rash noted.  Psychiatric: She has a normal mood and affect. Her speech is normal and behavior is  normal.    ED Course  Procedures (including critical care time)  Labs Reviewed - No data to display No results found.   No diagnosis found.    MDM  I will speak with the general surgeon and have them come and evaluate the patient        Toy Baker, MD 04/07/12 1551  Toy Baker, MD 04/07/12 1719

## 2012-04-07 NOTE — ED Notes (Signed)
Report given to conitra, rn on floor

## 2012-04-07 NOTE — Consult Note (Addendum)
[facility] Consultation  Requesting Provider: DR. Dwain Sarna Primary Care Physician:  Madolyn Frieze, Marylene Land, MD Primary Gastroenterologist:  Dr. Rhea Belton  Reason for Consultation:  Abnormal LFT's in setting of cholelithiasis and recent acute hepatitis B  Assessment:  Symptomatic cholelithiasis with change in LFT's from normal to abnormal since 1/19 and mildly elevated lipase- could have cholecystitis and/or CBD stone(s)    Recent resolving hepatitis B with return to negative Hep B surface antigen but not yet converted surface Ab as of last month      Recommendations: Proceed with cholecystectomy tomorrow most likely unless LFT's are markedly up or we think she has significant pancreatitis- then might change to pre-op    ERCP    If CBD stone on IOC can do ERCP following day (discussed this possibility with her and husband)    Check Hepatitis B surface Ab - do not think she is infectious at this time     HPI: Sheryl Howell is a 57 y.o. female with a history of acute hepatitis B this past Fall - and was resolving that with conversion to negative HBV DNA and HBSAg. HBSAb not yet converted. She was ok until recently began having intermittent epigastric pain and was found to have gallstones. Scheduled for a lap chole this week but analgesics failed to control pain in last 1-2 days so came to ED. Is being admitted with plans for lap chole tomorrow. She is better after IV analgesics. + nausea and dry heaves. No fever.   Past Medical History  Diagnosis Date  . Anxiety   . Depression   . Seizures   . Hypotension   . Anemia   . PONV (postoperative nausea and vomiting)   . Adverse effect of anesthetic     bp low-takes awhile to come up post op  . Nausea   . Hepatitis B     9/13-dx hep b    Past Surgical History  Procedure Date  . Knee surgery   . Wrist surgery   . Foot surgery   . Diagnostic laparoscopy 1992    exp lap fertility  . Bunionectomy     both feet  . Colonoscopy       Prior to Admission medications   Medication Sig Start Date End Date Taking? Authorizing Provider  diclofenac sodium (VOLTAREN) 1 % GEL Apply 2 g topically 4 (four) times daily as needed. For pain.   Yes Historical Provider, MD  fish oil-omega-3 fatty acids 1000 MG capsule Take 1 g by mouth daily.   Yes Historical Provider, MD  Multiple Vitamin (MULTIVITAMIN WITH MINERALS) TABS Take 1 tablet by mouth daily.   Yes Historical Provider, MD  ondansetron (ZOFRAN ODT) 8 MG disintegrating tablet Take 1 tablet (8 mg total) by mouth every 8 (eight) hours as needed for nausea. 04/05/12  Yes Lyanne Co, MD  oxyCODONE-acetaminophen (PERCOCET/ROXICET) 5-325 MG per tablet Take 1 tablet by mouth every 4 (four) hours as needed. Pain 04/05/12  Yes Lyanne Co, MD  venlafaxine XR (EFFEXOR-XR) 75 MG 24 hr capsule Take 75 mg by mouth daily.   Yes Historical Provider, MD      Allergies as of 04/07/2012 - Review Complete 04/07/2012  Allergen Reaction Noted  . Sulfa antibiotics Nausea Only 05/17/2010    Family History  Problem Relation Age of Onset  . Heart disease    . Cancer Sister     Uterine  . Cancer Maternal Uncle   . Cancer Paternal Aunt  Breast    History   Social History  . Marital Status: Married    Spouse Name:  Pennelope Bracken [facility]    Number of Children: 2       Occupational History  . teacher   Social History Main Topics  . Smoking status: Never Smoker   . Smokeless tobacco: Never Used  . Alcohol Use: Yes     Comment: rarely  . Drug Use: No  .        Review of Systems: Positive for those things mentioned in HPI All other review of systems negative except as mentioned in the HPI.  Physical Exam: Vital signs in last 24 hours: Temp:  [98.5 F (36.9 C)-98.7 F (37.1 C)] 98.5 F (36.9 C) (01/21 1543) Pulse Rate:  [55-68] 55  (01/21 1543) Resp:  [16-18] 18  (01/21 1543) BP: (110-123)/(71-72) 123/71 mmHg (01/21 1543) SpO2:  [95 %] 95 % (01/21  1543) Weight:  [148 lb (67.132 kg)-149 lb 6.4 oz (67.767 kg)] 148 lb (67.132 kg) (01/21 1145)   General:   Alert,  Well-developed, well-nourished, pleasant and cooperative in NAD Eyes:  Sclera clear, no obvious icterus.   Conjunctiva pink. Mouth:  No deformity or lesions.  Oropharynx pink & moist. Neck:  Supple; no masses or thyromegaly. Lungs:  Clear throughout to auscultation.   Heart:  Regular rate and rhythm; no murmurs, clicks, rubs,  or gallops. Abdomen:  Soft,  Moderately tender epigastrium and nondistended. No masses, hepatosplenomegaly or hernias noted. Normal bowel sounds, without guarding, and without rebound.   Extremities:  Without clubbing or edema. Neurologic:  Alert and  oriented x 3 Lymph Nodes:  No significant cervical or supraclavicular adenopathy. Psych:  Alert and cooperative. Slightly flat affect. S/p narcotics     Lab Results:  Basename 04/07/12 1551 04/05/12 1222  WBC 9.0 8.8  HGB 14.8 14.6  HCT 41.4 41.3  PLT 215 231   BMET  Basename 04/07/12 1551 04/05/12 1222  NA 140 135  K 3.4* 3.7  CL 103 99  CO2 26 26  GLUCOSE 106* 135*  BUN 6 11  CREATININE 0.72 0.64  CALCIUM 9.5 9.4   LFT  Basename 04/07/12 1551  PROT 6.9  ALBUMIN 3.9  AST 209*  ALT 309*  ALKPHOS 184*  BILITOT 3.2*  BILIDIR --  IBILI --   LFT's were normal on 04/05/12  Lab Results  Component Value Date   LIPASE 82* 04/07/2012    Previous Endoscopies: Colonoscopy elsewhere     LOS: 0 days      @Hanley Woerner  Sena Slate, MD, [facility] [phone] (pager) 04/07/2012 5:35 PM@

## 2012-04-07 NOTE — ED Notes (Addendum)
Pt was seen by surgery today and scheduled for cholecystectomy on 1/24. Pt states after she left the surgeon, the pain became severe and her PCP had told her to come to ED for severe pain.

## 2012-04-07 NOTE — Progress Notes (Signed)
Patient ID: Sheryl Howell, female   DOB: [birth year], 57 y.o.   MRN: [id number]  Chief Complaint  Patient presents with  . New Evaluation    eval chole    HPI Sheryl Howell is a 57 y.o. female.   HPI She presents after an attack of epigastric abdominal pain. She had a known history of gallstones after the diagnosis of hepatitis. This was her first attack of biliary colic. She developed moderate to severe epigastric abdominal pain and nausea. She had no emesis. She still has somewhat mild pain since his last attack 2 days ago. She denies jaundice. She did have an attack of what appeared to be acute hepatitis late last year. Problems were normal. Past Medical History  Diagnosis Date  . Hepatitis B   . Anxiety   . Depression   . Seizures   . Hypotension   . Anemia     Past Surgical History  Procedure Date  . Knee surgery   . Wrist surgery   . Foot surgery     Family History  Problem Relation Age of Onset  . Heart disease    . Cancer Sister     Uterine  . Cancer Maternal Uncle   . Cancer Paternal Aunt     Breast    Social History History  Substance Use Topics  . Smoking status: Never Smoker   . Smokeless tobacco: Never Used  . Alcohol Use: Yes     Comment: rarely    Allergies  Allergen Reactions  . Sulfa Antibiotics Nausea Only    Current Outpatient Prescriptions  Medication Sig Dispense Refill  . diclofenac sodium (VOLTAREN) 1 % GEL Apply 2 g topically 4 (four) times daily as needed. For pain.      . fish oil-omega-3 fatty acids 1000 MG capsule Take 1 g by mouth daily.      . Multiple Vitamin (MULTIVITAMIN WITH MINERALS) TABS Take 1 tablet by mouth daily.      . ondansetron (ZOFRAN ODT) 8 MG disintegrating tablet Take 1 tablet (8 mg total) by mouth every 8 (eight) hours as needed for nausea.  12 tablet  0  . oxyCODONE-acetaminophen (PERCOCET/ROXICET) 5-325 MG per tablet Take 1 tablet by mouth every 4 (four) hours as needed for pain.  20 tablet  0  .  venlafaxine XR (EFFEXOR-XR) 75 MG 24 hr capsule Take 75 mg by mouth daily.        Review of Systems Review of Systems  Constitutional: Negative for fever, chills and unexpected weight change.  HENT: Negative for hearing loss, congestion, sore throat, trouble swallowing and voice change.   Eyes: Negative for visual disturbance.  Respiratory: Negative for cough and wheezing.   Cardiovascular: Negative for chest pain, palpitations and leg swelling.  Gastrointestinal: Positive for nausea and abdominal pain. Negative for vomiting, diarrhea, constipation, blood in stool, abdominal distention and anal bleeding.  Genitourinary: Negative for hematuria, vaginal bleeding and difficulty urinating.  Musculoskeletal: Negative for arthralgias.  Skin: Negative for rash and wound.  Neurological: Negative for seizures, syncope and headaches.  Hematological: Negative for adenopathy. Does not bruise/bleed easily.  Psychiatric/Behavioral: Negative for confusion.    Blood pressure 110/72, pulse 68, temperature 98.7 F (37.1 C), temperature source Temporal, resp. rate 16, height 5' 2.38" (1.584 m), weight 149 lb 6.4 oz (67.767 kg).  Physical Exam Physical Exam  Constitutional: She is oriented to person, place, and time. She appears well-developed and well-nourished. No distress.  HENT:  Head: Normocephalic and atraumatic.  Right  Ear: External ear normal.  Left Ear: External ear normal.  Nose: Nose normal.  Mouth/Throat: Oropharynx is clear and moist. No oropharyngeal exudate.  Eyes: Conjunctivae normal are normal. Pupils are equal, round, and reactive to light. Right eye exhibits no discharge. Left eye exhibits no discharge. No scleral icterus.  Neck: Normal range of motion. Neck supple. No tracheal deviation present. No thyromegaly present.  Cardiovascular: Normal rate, regular rhythm, normal heart sounds and intact distal pulses.   No murmur heard. Pulmonary/Chest: Effort normal and breath sounds  normal. No respiratory distress. She has no wheezes.  Abdominal: Soft. Bowel sounds are normal. She exhibits no distension. There is tenderness. There is no rebound and no guarding.       There is mild tenderness with guarding in the right upper quadrant  Musculoskeletal: Normal range of motion. She exhibits no edema and no tenderness.  Neurological: She is alert and oriented to person, place, and time.  Skin: Skin is warm and dry. No rash noted. She is not diaphoretic. No erythema.  Psychiatric: Her behavior is normal. Judgment normal.    Data Reviewed I reviewed the patient's ultrasound showing cholelithiasis. There was no gallbladder wall thickening the bile duct was normal. There is a normal contour to the liver  Assessment     Symptomatic cholelithiasis    Plan    Laparoscopic cholecystectomy with possible cholangiogram is recommended. I discussed this with her in detail. I gave her literature regarding the surgery. I will also discuss with her gastroenterologist as to whether a liver biopsy should be done at the time of surgery given her recent diagnosis. I discussed the risks of surgery which includes but is not limited to bleeding, infection, bile duct injury, bile leak, injury to other structures, et [name]. She understands and wishes to proceed urgently. Surgery will be scheduled. Likelihood of success is good.       [name] A 04/07/2012, 10:18 AM

## 2012-04-07 NOTE — H&P (Addendum)
Sheryl Howell is an 57 y.o. female.   Chief Complaint: ruq/epigastric pain HPI:  41 yof who presents after first attack of epigastric and ruq pain this past Sunday.  Was seen in ER and underwent u/s that shows cholelithiasis.  Has also been treated for new diagnosis of hep b treated by Dr. Rhea Belton that has been improving.  She has had persistent intermittent pain since Sunday. She was seen in our office by Dr. Magnus Ivan today and scheduled for Friday. She went home and had recurrence of pain. This did not respond to oral pain meds and she came to er.  Has had some nausea, no emesis.  She has been constipated on meds.  Her urine has also been darker.    Her last seizure was 15 years ago.   Past Medical History  Diagnosis Date  . Anxiety   . Depression   . Seizures   . Hypotension   . Anemia   . PONV (postoperative nausea and vomiting)   . Adverse effect of anesthetic     bp low-takes awhile to come up post op  . Nausea   . Hepatitis B     9/13-dx hep b    Past Surgical History  Procedure Date  . Knee surgery   . Wrist surgery   . Foot surgery   . Diagnostic laparoscopy 1992    exp lap fertility  . Bunionectomy     both feet    Family History  Problem Relation Age of Onset  . Heart disease    . Cancer Sister     Uterine  . Cancer Maternal Uncle   . Cancer Paternal Aunt     Breast   Social History:  reports that she has never smoked. She has never used smokeless tobacco. She reports that she drinks alcohol. She reports that she does not use illicit drugs.  Allergies:  Allergies  Allergen Reactions  . Sulfa Antibiotics Nausea Only   meds reviewed  Results for orders placed during the hospital encounter of 04/07/12 (from the past 48 hour(s))  CBC WITH DIFFERENTIAL     Status: Normal   Collection Time   04/07/12  3:51 PM      Component Value Range Comment   WBC 9.0  4.0 - 10.5 K/uL    RBC 4.58  3.87 - 5.11 MIL/uL    Hemoglobin 14.8  12.0 - 15.0 g/dL    HCT 14.7   82.9 - 56.2 %    MCV 90.4  78.0 - 100.0 fL    MCH 32.3  26.0 - 34.0 pg    MCHC 35.7  30.0 - 36.0 g/dL    RDW 13.0  86.5 - 78.4 %    Platelets 215  150 - 400 K/uL    Neutrophils Relative 75  43 - 77 %    Neutro Abs 6.7  1.7 - 7.7 K/uL    Lymphocytes Relative 16  12 - 46 %    Lymphs Abs 1.5  0.7 - 4.0 K/uL    Monocytes Relative 8  3 - 12 %    Monocytes Absolute 0.7  0.1 - 1.0 K/uL    Eosinophils Relative 0  0 - 5 %    Eosinophils Absolute 0.0  0.0 - 0.7 K/uL    Basophils Relative 0  0 - 1 %    Basophils Absolute 0.0  0.0 - 0.1 K/uL    No results found.  Review of Systems  Constitutional: Positive for malaise/fatigue. Negative  for fever and chills.  Gastrointestinal: Positive for nausea and abdominal pain. Negative for vomiting and constipation.    Blood pressure 123/71, pulse 55, temperature 98.5 F (36.9 C), temperature source Oral, resp. rate 18, SpO2 95.00%. Physical Exam  Vitals reviewed. Constitutional: She appears well-developed and well-nourished.  HENT:  Mouth/Throat: No oropharyngeal exudate.  Eyes: No scleral icterus.  Cardiovascular: Normal rate, regular rhythm and normal heart sounds.   Respiratory: Effort normal and breath sounds normal. She has no wheezes. She has no rales.  GI: Soft. Bowel sounds are normal. She exhibits no distension. There is tenderness in the right upper quadrant. There is negative Murphy's sign.  Skin: Skin is warm and dry.     Assessment/Plan  Symptomatic cholelithiasis I discussed the procedure in detail.    We discussed the risks and benefits of a laparoscopic cholecystectomy and possible cholangiogram including, but not limited to bleeding, infection, injury to surrounding structures such as the intestine or liver, bile leak, retained gallstones, need to convert to an open procedure, prolonged diarrhea, blood clots such as  DVT, common bile duct injury, anesthesia risks, and possible need for additional procedures.  The likelihood of  improvement in symptoms and return to the patient's normal status is good. We discussed the typical post-operative recovery course.Discussed possible cholangiogram.  Will ask gi if a liver biopsy is of any help.     Addendum: lfts are all increased, will ask gi to see  [facility] 04/07/2012, 4:34 PM

## 2012-04-07 NOTE — Progress Notes (Signed)
Pt has hx of passing out easily-had been 15 yr-never on seizure meds-petite mal-hx Has been very nauseated-taking zofran Told to try to hydrate well until surgery-has hx bp dropping post op. Labs done 04/05/12

## 2012-04-07 NOTE — ED Notes (Signed)
BJY:[id number]<FA> Expected date:<BR> Expected time:<BR> Means of arrival:<BR> Comments:<BR> horsley

## 2012-04-08 ENCOUNTER — Observation Stay (HOSPITAL_COMMUNITY): Payer: BC Managed Care – PPO | Admitting: Anesthesiology

## 2012-04-08 ENCOUNTER — Observation Stay (HOSPITAL_COMMUNITY): Payer: BC Managed Care – PPO

## 2012-04-08 ENCOUNTER — Encounter (HOSPITAL_COMMUNITY): Admission: EM | Disposition: A | Discharge status: Home / Self Care | Payer: Self-pay | Source: Home / Self Care

## 2012-04-08 ENCOUNTER — Encounter (HOSPITAL_COMMUNITY): Payer: Self-pay | Admitting: Anesthesiology

## 2012-04-08 DIAGNOSIS — K831 Obstruction of bile duct: Secondary | ICD-10-CM | POA: Clinically undetermined

## 2012-04-08 HISTORY — PX: CHOLECYSTECTOMY: SHX55

## 2012-04-08 LAB — COMPREHENSIVE METABOLIC PANEL
ALT: 226 U/L — ABNORMAL HIGH (ref 0–35)
Alkaline Phosphatase: 164 U/L — ABNORMAL HIGH (ref 39–117)
CO2: 25 mEq/L (ref 19–32)
GFR calc Af Amer: >90 mL/min (ref >90)
GFR calc non Af Amer: >90 mL/min (ref >90)
Glucose, Bld: 100 mg/dL — ABNORMAL HIGH (ref 70–99)
Potassium: 3.7 mEq/L (ref 3.5–5.1)
Sodium: 138 mEq/L (ref 135–145)

## 2012-04-08 SURGERY — LAPAROSCOPIC CHOLECYSTECTOMY WITH INTRAOPERATIVE CHOLANGIOGRAM
Anesthesia: General | Site: Abdomen | Laterality: Right | Wound class: Dirty or Infected

## 2012-04-08 MED ORDER — HYDROMORPHONE HCL PF 1 MG/ML IJ SOLN
0.2500 mg | INTRAMUSCULAR | Status: DC | PRN
  Administered 2012-04-08 – 2012-04-09 (×7): 0.25 mg via INTRAVENOUS
  Filled 2012-04-08 (×6): qty 1

## 2012-04-08 MED ORDER — PROMETHAZINE HCL 25 MG/ML IJ SOLN: 6.2500 mg | INTRAMUSCULAR | Status: DC | PRN

## 2012-04-08 MED ORDER — VENLAFAXINE HCL ER 75 MG PO CP24
75.0000 mg | ORAL_CAPSULE | Freq: Every day | ORAL | Status: DC
  Administered 2012-04-08 – 2012-04-09 (×2): 75 mg via ORAL
  Filled 2012-04-08 (×3): qty 1

## 2012-04-08 MED ORDER — ONDANSETRON HCL 4 MG/2ML IJ SOLN
INTRAMUSCULAR | Status: DC | PRN
  Administered 2012-04-08: 2 mg via INTRAVENOUS

## 2012-04-08 MED ORDER — FENTANYL CITRATE 0.05 MG/ML IJ SOLN
INTRAMUSCULAR | Status: DC | PRN
  Administered 2012-04-08: 50 ug via INTRAVENOUS
  Administered 2012-04-08 (×2): 25 ug via INTRAVENOUS

## 2012-04-08 MED ORDER — EPHEDRINE SULFATE 50 MG/ML IJ SOLN
INTRAMUSCULAR | Status: DC | PRN
  Administered 2012-04-08: 10 mg via INTRAVENOUS

## 2012-04-08 MED ORDER — HYDROMORPHONE HCL PF 1 MG/ML IJ SOLN: 0.2500 mg | INTRAMUSCULAR | Status: DC | PRN

## 2012-04-08 MED ORDER — CISATRACURIUM BESYLATE (PF) 10 MG/5ML IV SOLN
INTRAVENOUS | Status: DC | PRN
  Administered 2012-04-08: 3 mg via INTRAVENOUS
  Administered 2012-04-08: 5 mg via INTRAVENOUS

## 2012-04-08 MED ORDER — LACTATED RINGERS IV SOLN
INTRAVENOUS | Status: DC | PRN
  Administered 2012-04-08: 4000 mL via INTRAVENOUS
  Administered 2012-04-08: 1000 mL via INTRAVENOUS

## 2012-04-08 MED ORDER — ACETAMINOPHEN 325 MG PO TABS: 650.0000 mg | ORAL_TABLET | Freq: Four times a day (QID) | ORAL | Status: DC | PRN

## 2012-04-08 MED ORDER — CEFAZOLIN SODIUM-DEXTROSE 2-3 GM-% IV SOLR
2.0000 g | Freq: Three times a day (TID) | INTRAVENOUS | Status: DC
  Administered 2012-04-08 – 2012-04-09 (×2): 2 g via INTRAVENOUS
  Filled 2012-04-08 (×3): qty 50

## 2012-04-08 MED ORDER — GLYCOPYRROLATE 0.2 MG/ML IJ SOLN
INTRAMUSCULAR | Status: DC | PRN
  Administered 2012-04-08: 0.4 mg via INTRAVENOUS

## 2012-04-08 MED ORDER — NEOSTIGMINE METHYLSULFATE 1 MG/ML IJ SOLN
INTRAMUSCULAR | Status: DC | PRN
  Administered 2012-04-08: 3 mg via INTRAVENOUS

## 2012-04-08 MED ORDER — BUPIVACAINE-EPINEPHRINE (PF) 0.25% -1:200000 IJ SOLN
INTRAMUSCULAR | Status: DC | PRN
  Administered 2012-04-08: 22 mL

## 2012-04-08 MED ORDER — MIDAZOLAM HCL 5 MG/5ML IJ SOLN
INTRAMUSCULAR | Status: DC | PRN
  Administered 2012-04-08 (×2): 0.5 mg via INTRAVENOUS
  Administered 2012-04-08: 1 mg via INTRAVENOUS

## 2012-04-08 MED ORDER — PROPOFOL 10 MG/ML IV EMUL
INTRAVENOUS | Status: DC | PRN
  Administered 2012-04-08: 150 mg via INTRAVENOUS

## 2012-04-08 MED ORDER — IOHEXOL 300 MG/ML  SOLN
INTRAMUSCULAR | Status: DC | PRN
  Administered 2012-04-08: 20 mL via INTRAVENOUS

## 2012-04-08 MED ORDER — LACTATED RINGERS IV SOLN
INTRAVENOUS | Status: DC
  Administered 2012-04-08: 1000 mL via INTRAVENOUS

## 2012-04-08 MED ORDER — OXYCODONE-ACETAMINOPHEN 5-325 MG PO TABS: 1.0000 | ORAL_TABLET | ORAL | Status: DC | PRN

## 2012-04-08 MED ORDER — SUCCINYLCHOLINE CHLORIDE 20 MG/ML IJ SOLN
INTRAMUSCULAR | Status: DC | PRN
  Administered 2012-04-08: 100 mg via INTRAVENOUS

## 2012-04-08 MED ORDER — SODIUM CHLORIDE 0.9 % IV SOLN
INTRAVENOUS | Status: DC
  Administered 2012-04-09: 08:00:00 via INTRAVENOUS
  Administered 2012-04-10: 75 mL/h via INTRAVENOUS

## 2012-04-08 MED ORDER — ONDANSETRON HCL 4 MG/2ML IJ SOLN: 4.0000 mg | Freq: Four times a day (QID) | INTRAMUSCULAR | Status: DC | PRN

## 2012-04-08 MED ORDER — ACETAMINOPHEN 650 MG RE SUPP: 650.0000 mg | Freq: Four times a day (QID) | RECTAL | Status: DC | PRN

## 2012-04-08 MED ORDER — LACTATED RINGERS IV SOLN
INTRAVENOUS | Status: DC | PRN
  Administered 2012-04-08: 11:00:00 via INTRAVENOUS

## 2012-04-08 MED ORDER — MORPHINE SULFATE 2 MG/ML IJ SOLN
2.0000 mg | INTRAMUSCULAR | Status: DC | PRN
  Administered 2012-04-08 (×3): 2 mg via INTRAVENOUS
  Filled 2012-04-08 (×3): qty 1

## 2012-04-08 MED ORDER — LIDOCAINE HCL (CARDIAC) 20 MG/ML IV SOLN
INTRAVENOUS | Status: DC | PRN
  Administered 2012-04-08: 20 mg via INTRAVENOUS

## 2012-04-08 MED ORDER — PANTOPRAZOLE SODIUM 40 MG IV SOLR
40.0000 mg | Freq: Every day | INTRAVENOUS | Status: DC
  Administered 2012-04-08 – 2012-04-09 (×2): 40 mg via INTRAVENOUS
  Filled 2012-04-08 (×3): qty 40

## 2012-04-08 MED ORDER — HYDROMORPHONE HCL PF 1 MG/ML IJ SOLN
INTRAMUSCULAR | Status: AC
  Filled 2012-04-08: qty 1

## 2012-04-08 MED ORDER — CEFAZOLIN SODIUM-DEXTROSE 2-3 GM-% IV SOLR: 2.0000 g | INTRAVENOUS | Status: DC

## 2012-04-08 SURGICAL SUPPLY — 39 items
APPLIER CLIP 5 13 M/L LIGAMAX5 (MISCELLANEOUS)
APPLIER CLIP ROT 10 11.4 M/L (STAPLE) ×2
BENZOIN TINCTURE PRP APPL 2/3 (GAUZE/BANDAGES/DRESSINGS) IMPLANT
CANISTER SUCTION 2500CC (MISCELLANEOUS) ×4 IMPLANT
CLIP APPLIE 5 13 M/L LIGAMAX5 (MISCELLANEOUS) IMPLANT
CLIP APPLIE ROT 10 11.4 M/L (STAPLE) ×1 IMPLANT
CLOTH BEACON ORANGE TIMEOUT ST (SAFETY) ×2 IMPLANT
COVER MAYO STAND STRL (DRAPES) ×2 IMPLANT
DECANTER SPIKE VIAL GLASS SM (MISCELLANEOUS) ×2 IMPLANT
DERMABOND ADVANCED (GAUZE/BANDAGES/DRESSINGS) ×1
DERMABOND ADVANCED .7 DNX12 (GAUZE/BANDAGES/DRESSINGS) ×1 IMPLANT
DRAPE C-ARM 42X72 X-RAY (DRAPES) ×2 IMPLANT
DRAPE LAPAROSCOPIC ABDOMINAL (DRAPES) ×2 IMPLANT
ELECT REM PT RETURN 9FT ADLT (ELECTROSURGICAL) ×2
ELECTRODE REM PT RTRN 9FT ADLT (ELECTROSURGICAL) ×1 IMPLANT
GLOVE BIO SURGEON STRL SZ7 (GLOVE) ×2 IMPLANT
GLOVE BIOGEL PI IND STRL 7.5 (GLOVE) ×1 IMPLANT
GLOVE BIOGEL PI INDICATOR 7.5 (GLOVE) ×1
GOWN PREVENTION PLUS LG XLONG (DISPOSABLE) ×2 IMPLANT
GOWN PREVENTION PLUS XLARGE (GOWN DISPOSABLE) ×2 IMPLANT
GOWN STRL NON-REIN LRG LVL3 (GOWN DISPOSABLE) ×2 IMPLANT
GOWN STRL REIN XL XLG (GOWN DISPOSABLE) IMPLANT
KIT BASIN OR (CUSTOM PROCEDURE TRAY) ×2 IMPLANT
NS IRRIG 1000ML POUR BTL (IV SOLUTION) ×2 IMPLANT
POUCH SPECIMEN RETRIEVAL 10MM (ENDOMECHANICALS) ×2 IMPLANT
SET CHOLANGIOGRAPH MIX (MISCELLANEOUS) ×2 IMPLANT
SET IRRIG TUBING LAPAROSCOPIC (IRRIGATION / IRRIGATOR) ×2 IMPLANT
SOLUTION ANTI FOG 6CC (MISCELLANEOUS) ×2 IMPLANT
STRIP CLOSURE SKIN 1/2X4 (GAUZE/BANDAGES/DRESSINGS) IMPLANT
SUT MNCRL AB 4-0 PS2 18 (SUTURE) ×2 IMPLANT
SUT VICRYL 0 ENDOLOOP (SUTURE) ×2 IMPLANT
SUT VICRYL 0 UR6 27IN ABS (SUTURE) ×4 IMPLANT
TAPE STRIPS DRAPE STRL (GAUZE/BANDAGES/DRESSINGS) ×2 IMPLANT
TOWEL OR 17X26 10 PK STRL BLUE (TOWEL DISPOSABLE) ×2 IMPLANT
TRAY LAP CHOLE (CUSTOM PROCEDURE TRAY) ×2 IMPLANT
TROCAR BLADELESS OPT 5 75 (ENDOMECHANICALS) ×6 IMPLANT
TROCAR XCEL BLUNT TIP 100MML (ENDOMECHANICALS) ×2 IMPLANT
TROCAR XCEL NON-BLD 11X100MML (ENDOMECHANICALS) IMPLANT
TUBING INSUFFLATION 10FT LAP (TUBING) ×2 IMPLANT

## 2012-04-08 NOTE — Anesthesia Postprocedure Evaluation (Signed)
Anesthesia Post Note  Patient: Sheryl Howell  Procedure(s) Performed: Procedure(s) (LRB): LAPAROSCOPIC CHOLECYSTECTOMY WITH INTRAOPERATIVE CHOLANGIOGRAM (Right)  Anesthesia type: General  Patient location: PACU  Post pain: Pain level controlled  Post assessment: Post-op Vital signs reviewed  Last Vitals: BP 121/77  Pulse 83  Temp 37.3 C (Oral)  Resp 12  Ht 5' 2.5" (1.588 m)  Wt 148 lb (67.132 kg)  BMI 26.64 kg/m2  SpO2 99%  Post vital signs: Reviewed  Level of consciousness: sedated  Complications: No apparent anesthesia complications

## 2012-04-08 NOTE — Op Note (Addendum)
Preoperative diagnosis: Acute cholecystitis, possible choledocholithiasis Postoperative diagnosis: Same as above Procedure: Laparoscopic cholecystectomy with cholangiogram Surgeon: Dr. Dwain Sarna Anesthesia: Gen. Endotracheal Specimens: Gallbladder and contents to pathology Estimated blood loss: Minimal Drains: None Complications: None Sponge and needle count correct at end of operation Disposition to recovery in stable condition  Indications: This is a 57 yo female with several days of epigastric and right upper quadrant pain. She was seen in our office yesterday and was scheduled for Friday to have a gallbladder removal. She did develop worsening pain and came to the emergency room. Upon evaluation she was found to have elevated liver function tests. These were about the same to better the following day. In decision with gastroenterology we decided proceed with surgery today. We discussed laparoscopic cholecystectomy with a cholangiogram and the risks and benefits associated with that.  Procedure: After informed consent was obtained the patient was taken to the operating room. She was administered  cefoxitin. Sequential compression devices were on the legs. She was then placed under general anesthesia without complication. Her abdomen was prepped and draped in the standard sterile surgical fashion. A surgical timeout was performed.  I infiltrated Marcaine below her umbilicus and made a vertical incision. I grasped her fascia and incised it. Her peritoneum was then entered bluntly. I placed a 0 Vicryl pursestring suture through the fascia. I then inserted a Hassan trocar and insufflated the abdomen to 15 mm mercury pressure. She was noted to have acute cholecystitis with a couple of ischemic areas on her gallbladder. Her gallbladder was very tense. I then inserted 3 further 5 mm trocars in the epigastrium and right upper quadrant under direct vision without complication after infiltration with local  anesthetic. I then decompressed the gallbladder. I then retracted it cephalad and lateral. I was able to eventually obtain the critical view of safety. I clipped the artery and divided it. Her cystic duct was very enlarged. I was able to also see her common bile duct very well. I then clipped the duct distally. I made a ductotomy. I milked a number of very small stones out of her duct. I then did a cholangiogram. This showed what appeared to be some small stones in her duct. I filled both sides of her liver. I was in her cystic duct. I was not able to fill her duodenum and I think there was a meniscus at that site. I think she will need an ERCP postop. I placed 3 clips on the duct and divided it. I additionally placed a Vicryl Endoloop. I then removed the gallbladder from the liver bed with some difficulty as it was fused to it. I did enter into the gallbladder and spilled small stones. The gallbladder bed she was placed in Endo Catch bag and removed. I used 4 L of irrigation to clean up all of the stones that spilled I could not find any more upon completion. Everything looked hemostatic. I then removed my trocars. The abdomen was desufflated. I then closed my umbilical stitch. I did place one additional figure-of-eight suture of 0 Vicryl and the umbilicus. I then closed using 4-0 Monocryl and Dermabond. Steri-Strips were placed over this. She tolerated this well was extubated and transferred to the recovery room in stable condition.  I did not do a liver biopsy as it appeared normal. I did take pictures which should be in the chart.

## 2012-04-08 NOTE — Anesthesia Preprocedure Evaluation (Addendum)
Anesthesia Evaluation  Patient identified by MRN, date of birth, ID band Patient awake    Reviewed: Allergy & Precautions, H&P , NPO status , Patient's Chart, lab work & pertinent test results, reviewed documented beta blocker date and time   History of Anesthesia Complications (+) PONV  Airway Mallampati: II TM Distance: >3 FB Neck ROM: full    Dental No notable dental hx.    Pulmonary neg pulmonary ROS,  breath sounds clear to auscultation  Pulmonary exam normal       Cardiovascular Exercise Tolerance: Good negative cardio ROS  Rhythm:regular Rate:Normal     Neuro/Psych Seizures -, Well Controlled,  PSYCHIATRIC DISORDERS Last seizure 15 years ago. negative neurological ROS  negative psych ROS   GI/Hepatic negative GI ROS, (+) Hepatitis -, B  Endo/Other  negative endocrine ROS  Renal/GU negative Renal ROS  negative genitourinary   Musculoskeletal   Abdominal   Peds  Hematology negative hematology ROS (+) anemia ,   Anesthesia Other Findings   Reproductive/Obstetrics negative OB ROS                           Anesthesia Physical Anesthesia Plan  ASA: II  Anesthesia Plan: General ETT   Post-op Pain Management:    Induction:   Airway Management Planned:   Additional Equipment:   Intra-op Plan:   Post-operative Plan:   Informed Consent: I have reviewed the patients History and Physical, chart, labs and discussed the procedure including the risks, benefits and alternatives for the proposed anesthesia with the patient or authorized representative who has indicated his/her understanding and acceptance.   Dental Advisory Given  Plan Discussed with: CRNA  Anesthesia Plan Comments: (No IV tylenol.)       Anesthesia Quick Evaluation

## 2012-04-08 NOTE — Care Management Note (Signed)
    Page 1 of 1   04/08/2012     4:09:39 PM   CARE MANAGEMENT NOTE 04/08/2012  Patient:  Sheryl Howell, Sheryl Howell   Account Number:  [id number]  Date Initiated:  04/08/2012  Documentation initiated by:  Lorenda Ishihara  Subjective/Objective Assessment:   57 yo female admitted s/p lap chole with acute cholecystitis. PTA lived at home with spouse.     Action/Plan:   Home when stable, awaiting ERCP   Anticipated DC Date:  04/10/2012   Anticipated DC Plan:  HOME/SELF CARE      DC Planning Services  CM consult      Choice offered to / List presented to:             Status of service:  Completed, signed off Medicare Important Message given?   (If response is "NO", the following Medicare IM given date fields will be blank) Date Medicare IM given:   Date Additional Medicare IM given:    Discharge Disposition:  HOME/SELF CARE  Per UR Regulation:  Reviewed for med. necessity/level of care/duration of stay  If discussed at Long Length of Stay Meetings, dates discussed:    Comments:

## 2012-04-08 NOTE — Progress Notes (Addendum)
Communicated with Dr. Dwain Sarna - gravel in CBD, some flushed but did not empty into duodenum on IOC. Patient and husband seen post-op. Plan for likely ERCP tomorrow. The risks and benefits as well as alternatives of endoscopic procedure(s) have been discussed and reviewed. All questions answered. The patient agrees to proceed.  Hep B S ab now + - she has cleared this

## 2012-04-08 NOTE — Anesthesia Postprocedure Evaluation (Signed)
  Anesthesia Post-op Note  Patient: Sheryl Howell  Procedure(s) Performed: Procedure(s) (LRB): LAPAROSCOPIC CHOLECYSTECTOMY WITH INTRAOPERATIVE CHOLANGIOGRAM (Right)  Patient Location: PACU  Anesthesia Type: General  Level of Consciousness: awake and alert   Airway and Oxygen Therapy: Patient Spontanous Breathing  Post-op Pain: mild  Post-op Assessment: Post-op Vital signs reviewed, Patient's Cardiovascular Status Stable, Respiratory Function Stable, Patent Airway and No signs of Nausea or vomiting  Last Vitals:  Filed Vitals:   04/08/12 1330  BP: 127/61  Pulse: 64  Temp: 37 C  Resp: 13    Post-op Vital Signs: stable   Complications: No apparent anesthesia complications

## 2012-04-08 NOTE — Progress Notes (Signed)
  Subjective: Feels better this am  Objective: Vital signs in last 24 hours: Temp:  [98.2 F (36.8 C)-99.2 F (37.3 C)] 99.2 F (37.3 C) (01/22 0524) Pulse Rate:  [54-70] 70  (01/22 0524) Resp:  [14-18] 14  (01/22 0524) BP: (93-142)/(50-84) 96/50 mmHg (01/22 0524) SpO2:  [92 %-97 %] 93 % (01/22 0524) Weight:  [148 lb (67.132 kg)-149 lb 6.4 oz (67.767 kg)] 148 lb (67.132 kg) (01/21 1837) Last BM Date: 04/01/12  Intake/Output from previous day: 01/21 0701 - 01/22 0700 In: 842.5 [I.V.:842.5] Out: 1551 [Urine:1551] Intake/Output this shift:   unchanged  Lab Results:   Basename 04/07/12 1551 04/05/12 1222  WBC 9.0 8.8  HGB 14.8 14.6  HCT 41.4 41.3  PLT 215 231   BMET  Basename 04/08/12 0435 04/07/12 1551  NA 138 140  K 3.7 3.4*  CL 103 103  CO2 25 26  GLUCOSE 100* 106*  BUN 5* 6  CREATININE 0.69 0.72  CALCIUM 8.8 9.5    Anti-infectives: Anti-infectives     Start     Dose/Rate Route Frequency Ordered Stop   04/07/12 1800   cefOXitin (MEFOXIN) 1 g in dextrose 5 % 50 mL IVPB        1 g 100 mL/hr over 30 Minutes Intravenous 4 times per day 04/07/12 1634            Assessment/Plan: POD 1 Discussed with Dr. Leone Payor, will plan on lap chole with ioc today and ercp if needed postop Bilirubin is up but everything else better  [facility] 04/08/2012

## 2012-04-08 NOTE — Transfer of Care (Signed)
Immediate Anesthesia Transfer of Care Note  Patient: Sheryl Howell  Procedure(s) Performed: Procedure(s) (LRB) with comments: LAPAROSCOPIC CHOLECYSTECTOMY WITH INTRAOPERATIVE CHOLANGIOGRAM (Right) - C-  Arm Doctor will be at [facility] Day, request to start at 1100.  Patient Location: PACU  Anesthesia Type:General  Level of Consciousness: awake, alert , oriented and patient cooperative  Airway & Oxygen Therapy: Patient Spontanous Breathing, Patient connected to face mask oxygen and Patient connected to face mask  Post-op Assessment: Report given to PACU RN  Post vital signs: Reviewed and stable  Complications: No apparent anesthesia complications  Denies pain.

## 2012-04-09 ENCOUNTER — Telehealth (INDEPENDENT_AMBULATORY_CARE_PROVIDER_SITE_OTHER): Payer: Self-pay | Admitting: General Surgery

## 2012-04-09 ENCOUNTER — Encounter (HOSPITAL_COMMUNITY): Admission: EM | Disposition: A | Discharge status: Home / Self Care | Payer: Self-pay | Source: Home / Self Care

## 2012-04-09 ENCOUNTER — Encounter (HOSPITAL_COMMUNITY): Payer: Self-pay | Admitting: *Deleted

## 2012-04-09 ENCOUNTER — Encounter (HOSPITAL_COMMUNITY): Payer: Self-pay | Admitting: Anesthesiology

## 2012-04-09 ENCOUNTER — Inpatient Hospital Stay (HOSPITAL_COMMUNITY): Payer: BC Managed Care – PPO | Admitting: Anesthesiology

## 2012-04-09 ENCOUNTER — Telehealth: Payer: Self-pay | Admitting: *Deleted

## 2012-04-09 ENCOUNTER — Inpatient Hospital Stay (HOSPITAL_COMMUNITY): Payer: BC Managed Care – PPO

## 2012-04-09 DIAGNOSIS — D649 Anemia, unspecified: Secondary | ICD-10-CM

## 2012-04-09 DIAGNOSIS — Z8619 Personal history of other infectious and parasitic diseases: Secondary | ICD-10-CM | POA: Diagnosis present

## 2012-04-09 DIAGNOSIS — K8001 Calculus of gallbladder with acute cholecystitis with obstruction: Secondary | ICD-10-CM

## 2012-04-09 DIAGNOSIS — Z8669 Personal history of other diseases of the nervous system and sense organs: Secondary | ICD-10-CM

## 2012-04-09 DIAGNOSIS — Z8659 Personal history of other mental and behavioral disorders: Secondary | ICD-10-CM

## 2012-04-09 DIAGNOSIS — K8050 Calculus of bile duct without cholangitis or cholecystitis without obstruction: Secondary | ICD-10-CM | POA: Diagnosis present

## 2012-04-09 HISTORY — DX: Calculus of gallbladder with acute cholecystitis with obstruction: K80.01

## 2012-04-09 HISTORY — DX: Personal history of other infectious and parasitic diseases: Z86.19

## 2012-04-09 HISTORY — DX: Anemia, unspecified: D64.9

## 2012-04-09 HISTORY — DX: Personal history of other mental and behavioral disorders: Z86.59

## 2012-04-09 HISTORY — PX: ERCP: SHX5425

## 2012-04-09 HISTORY — DX: Personal history of other diseases of the nervous system and sense organs: Z86.69

## 2012-04-09 LAB — COMPREHENSIVE METABOLIC PANEL
ALT: 200 U/L — ABNORMAL HIGH (ref 0–35)
AST: 116 U/L — ABNORMAL HIGH (ref 0–37)
Alkaline Phosphatase: 174 U/L — ABNORMAL HIGH (ref 39–117)
CO2: 28 mEq/L (ref 19–32)
Chloride: 103 mEq/L (ref 96–112)
GFR calc Af Amer: >90 mL/min (ref >90)
GFR calc non Af Amer: >90 mL/min (ref >90)
Glucose, Bld: 103 mg/dL — ABNORMAL HIGH (ref 70–99)
Potassium: 3.7 mEq/L (ref 3.5–5.1)
Sodium: 138 mEq/L (ref 135–145)
Total Bilirubin: 4.5 mg/dL — ABNORMAL HIGH (ref 0.3–1.2)

## 2012-04-09 SURGERY — ERCP, WITH INTERVENTION IF INDICATED
Anesthesia: General

## 2012-04-09 SURGERY — ERCP, WITH INTERVENTION IF INDICATED
Anesthesia: Monitor Anesthesia Care

## 2012-04-09 MED ORDER — MIDAZOLAM HCL 5 MG/5ML IJ SOLN
INTRAMUSCULAR | Status: DC | PRN
  Administered 2012-04-09: 2 mg via INTRAVENOUS

## 2012-04-09 MED ORDER — DEXAMETHASONE SODIUM PHOSPHATE 10 MG/ML IJ SOLN
INTRAMUSCULAR | Status: DC | PRN
  Administered 2012-04-09: 5 mg via INTRAVENOUS

## 2012-04-09 MED ORDER — PROMETHAZINE HCL 25 MG/ML IJ SOLN: 6.2500 mg | INTRAMUSCULAR | Status: DC | PRN

## 2012-04-09 MED ORDER — SUCCINYLCHOLINE CHLORIDE 20 MG/ML IJ SOLN
INTRAMUSCULAR | Status: DC | PRN
  Administered 2012-04-09: 100 mg via INTRAVENOUS

## 2012-04-09 MED ORDER — NEOSTIGMINE METHYLSULFATE 1 MG/ML IJ SOLN
INTRAMUSCULAR | Status: DC | PRN
  Administered 2012-04-09: 4 mg via INTRAVENOUS

## 2012-04-09 MED ORDER — ROCURONIUM BROMIDE 100 MG/10ML IV SOLN
INTRAVENOUS | Status: DC | PRN
  Administered 2012-04-09: 20 mg via INTRAVENOUS

## 2012-04-09 MED ORDER — SODIUM CHLORIDE 0.9 % IV SOLN
INTRAVENOUS | Status: DC | PRN
  Administered 2012-04-09: 14:00:00

## 2012-04-09 MED ORDER — LACTATED RINGERS IV SOLN: INTRAVENOUS | Status: DC

## 2012-04-09 MED ORDER — SODIUM CHLORIDE 0.9 % IV SOLN
INTRAVENOUS | Status: DC | PRN
  Administered 2012-04-09 (×2): via INTRAVENOUS

## 2012-04-09 MED ORDER — SODIUM CHLORIDE 0.45 % IV SOLN: INTRAVENOUS | Status: DC

## 2012-04-09 MED ORDER — FENTANYL CITRATE 0.05 MG/ML IJ SOLN
INTRAMUSCULAR | Status: DC | PRN
  Administered 2012-04-09: 50 ug via INTRAVENOUS

## 2012-04-09 MED ORDER — GLYCOPYRROLATE 0.2 MG/ML IJ SOLN
INTRAMUSCULAR | Status: DC | PRN
  Administered 2012-04-09: .6 mg via INTRAVENOUS

## 2012-04-09 MED ORDER — LIDOCAINE HCL (CARDIAC) 20 MG/ML IV SOLN
INTRAVENOUS | Status: DC | PRN
  Administered 2012-04-09: 50 mg via INTRAVENOUS

## 2012-04-09 MED ORDER — ONDANSETRON HCL 4 MG/2ML IJ SOLN
INTRAMUSCULAR | Status: DC | PRN
  Administered 2012-04-09: 4 mg via INTRAVENOUS

## 2012-04-09 MED ORDER — PROPOFOL 10 MG/ML IV BOLUS
INTRAVENOUS | Status: DC | PRN
  Administered 2012-04-09: 160 mg via INTRAVENOUS

## 2012-04-09 NOTE — Telephone Encounter (Signed)
Patient has been in hospital sick with gallbladder and is now status post cholecystectomy She converted her hepatitis B surface antibody positive and normalized her liver enzymes She is immune to hepatitis B and all further liver labs to be canceled ----- Message ----- From: Iva Boop, MD Sent: 04/08/2012 2:55 PM To: Beverley Fiedler, MD She converted Do you want to cancel other labs?  Cancelled future labs/ cb

## 2012-04-09 NOTE — Op Note (Signed)
[facility] [address] [place], [address]   ERCP PROCEDURE REPORT  PATIENT: Sheryl Howell, Sheryl Howell  MR# :[id number] BIRTHDATE: March 16, 1956  GENDER: Female ENDOSCOPIST: Iva Boop, MD, Clementeen Graham REFERRED BY: Emelia Loron, M.D. PROCEDURE DATE:  04/09/2012 PROCEDURE:   ERCP with sphincterotomy/papillotomy and ERCP with removal of calculus/calculi ASA CLASS:   Class II INDICATIONS:established bile duct stone(s). s/p lap chole yesterday MEDICATIONS: See Anesthesia Report.   Ancef IV had been running every 8 hrs TOPICAL ANESTHETIC: none  DESCRIPTION OF PROCEDURE:   After the risks benefits and alternatives of the procedure were thoroughly explained, informed consent was obtained.  The    duodenoscope was introduced through the mouth  and advanced to the second portion of the duodenum .  The upper, middle and distal third of the esophagus were not seen well with side-view scope.  The z-line was well seen at the GEJ. The endoscope was pushed into the fundus which was normal.  The antrum, gastric body, first and second part of the duodenum were unremarkable.  The papilla was normal. It was cannulated with a wire and sphincterotome. Deep cannulation obtained and contrast injected, showing one stone at least. Duct was mildly dilated up to 12 mm. gallbladder absent. Biliary sphincterotomy performed and some stone fragments spontaneously passed. Balloon inserted and inflated to 12 mm. Several sweeps produced multiple stone fragments. Occlussion cholangiogram negative. No attempt at pancreas cannulation. The scope was then completely withdrawn from the patient and the procedure terminated.     COMPLICATIONS: .  There were no complications.  ENDOSCOPIC IMPRESSION: 1.   Normal endoscopic survey ofstomach and duodenum 2.   Common bile duct stone(s) removed after biliary sphincterotomy, mildly dilated CBD up to 12 mm.  RECOMMENDATIONS: Observe overnight DC  Ancef home tomorrow if ok      eSigned:  Iva Boop, MD, [facility] 04/09/2012 2:41 PM   CC:The Patient

## 2012-04-09 NOTE — Anesthesia Preprocedure Evaluation (Addendum)
Anesthesia Evaluation  Patient identified by MRN, date of birth, ID band Patient awake    Reviewed: Allergy & Precautions, H&P , NPO status , Patient's Chart, lab work & pertinent test results, reviewed documented beta blocker date and time   History of Anesthesia Complications (+) PONV  Airway Mallampati: II TM Distance: >3 FB Neck ROM: full    Dental No notable dental hx.    Pulmonary neg pulmonary ROS,  breath sounds clear to auscultation  Pulmonary exam normal       Cardiovascular Exercise Tolerance: Good negative cardio ROS  Rhythm:regular Rate:Normal     Neuro/Psych Seizures -, Well Controlled,  PSYCHIATRIC DISORDERS Anxiety Depression Last seizure 15 years ago. negative neurological ROS  negative psych ROS   GI/Hepatic negative GI ROS, (+) Hepatitis -, B  Endo/Other  negative endocrine ROS  Renal/GU negative Renal ROS  negative genitourinary   Musculoskeletal   Abdominal   Peds  Hematology negative hematology ROS (+)   Anesthesia Other Findings   Reproductive/Obstetrics negative OB ROS                           Anesthesia Physical Anesthesia Plan  ASA: II  Anesthesia Plan: General   Post-op Pain Management:    Induction: Intravenous  Airway Management Planned: Oral ETT  Additional Equipment:   Intra-op Plan:   Post-operative Plan: Extubation in OR  Informed Consent: I have reviewed the patients History and Physical, chart, labs and discussed the procedure including the risks, benefits and alternatives for the proposed anesthesia with the patient or authorized representative who has indicated his/her understanding and acceptance.   Dental advisory given  Plan Discussed with: CRNA  Anesthesia Plan Comments:         Anesthesia Quick Evaluation

## 2012-04-09 NOTE — Transfer of Care (Signed)
Immediate Anesthesia Transfer of Care Note  Patient: Sheryl Howell  Procedure(s) Performed: Procedure(s) (LRB) with comments: ENDOSCOPIC RETROGRADE CHOLANGIOPANCREATOGRAPHY (ERCP) (N/A)  Patient Location: PACU and Endoscopy Unit  Anesthesia Type:General  Level of Consciousness: awake, oriented and patient cooperative  Airway & Oxygen Therapy: Patient Spontanous Breathing and Patient connected to face mask oxygen  Post-op Assessment: Report given to PACU RN  Post vital signs: Reviewed and stable  Complications: No apparent anesthesia complications

## 2012-04-09 NOTE — Progress Notes (Signed)
[facility] Progress Note  SUBJECTIVE: For ERCP today. Uneventful night. Feels ok   OBJECTIVE:  Vital signs in last 24 hours: Temp:  [97.7 F (36.5 C)-99.2 F (37.3 C)] 98.9 F (37.2 C) (01/23 0533) Pulse Rate:  [58-87] 87  (01/23 0533) Resp:  [12-17] 14  (01/23 0533) BP: (105-131)/(59-82) 105/68 mmHg (01/23 0533) SpO2:  [90 %-100 %] 92 % (01/23 0533) Last BM Date: 04/01/12 General:    Pleasant white female in NAD Neurologic:  Alert and oriented,  grossly normal neurologically. Psych:  Cooperative. Normal mood and affect.  I Lab Results:  Basename 04/07/12 1551  WBC 9.0  HGB 14.8  HCT 41.4  PLT 215   BMET  Basename 04/09/12 0400 04/08/12 0435 04/07/12 1551  NA 138 138 140  K 3.7 3.7 3.4*  CL 103 103 103  CO2 28 25 26   GLUCOSE 103* 100* 106*  BUN 4* 5* 6  CREATININE 0.63 0.69 0.72  CALCIUM 8.2* 8.8 9.5   LFT  Basename 04/09/12 0400  PROT 5.4*  ALBUMIN 2.8*  AST 116*  ALT 200*  ALKPHOS 174*  BILITOT 4.5*  BILIDIR --  IBILI --    Studies/Results: Dg Cholangiogram Operative  04/08/2012  *RADIOLOGY REPORT*  Clinical Data:     Cholelithiasis.  INTRAOPERATIVE CHOLANGIOGRAM  Technique:  Cholangiographic images from the C-arm fluoroscopic device were submitted for interpretation post-operatively.  Please see the procedural report for the amount of contrast and the fluoroscopy time utilized.  Comparison:  Ultrasound of the abdomen 03/09/2012 and 04/05/2012.  Findings:  The gallbladder has been removed and the cystic duct cannulated.  There is good filling of the common hepatic duct, cystic duct, and common bile duct.  The biliary tree appears dilated.  There is a meniscus at the distal CBD.  There is no passage of contrast into the duodenum.  IMPRESSION: Distended biliary tree.  Suspect distal CBD stones.  No passage of contrast into the duodenum.   Original Report Authenticated By: Davonna Belling, M.D.      ASSESSMENT / PLAN:  Choledocholithiasis.  Bilirubin up to 4.5, alk phos up to 174. For ERCP today. Has low grade temp. On Ancef. She is NPO. No questions regarding procedure.    LOS: 2 days   Willette Cluster  04/09/2012, 10:39 AM   [facility] Attending  I have also seen and assessed the patient and agree with the above note.  Iva Boop, MD, [facility] [phone] (pager) 04/09/2012 1:39 PM

## 2012-04-09 NOTE — Progress Notes (Signed)
1 Day Post-Op  Subjective: Feels fine today  Objective: Vital signs in last 24 hours: Temp:  [97.7 F (36.5 C)-99.2 F (37.3 C)] 98.9 F (37.2 C) (01/23 0533) Pulse Rate:  [58-87] 87  (01/23 0533) Resp:  [12-17] 14  (01/23 0533) BP: (105-131)/(59-82) 105/68 mmHg (01/23 0533) SpO2:  [90 %-100 %] 92 % (01/23 0533) Last BM Date: 04/01/12  Intake/Output from previous day: 01/22 0701 - 01/23 0700 In: 2292.5 [P.O.:120; I.V.:2172.5] Out: 2305 [Urine:2300; Blood:5] Intake/Output this shift:    General appearance: no distress GI: approp tender wounds clean  Lab Results:   Basename 04/07/12 1551  WBC 9.0  HGB 14.8  HCT 41.4  PLT 215   BMET  Basename 04/09/12 0400 04/08/12 0435  NA 138 138  K 3.7 3.7  CL 103 103  CO2 28 25  GLUCOSE 103* 100*  BUN 4* 5*  CREATININE 0.63 0.69  CALCIUM 8.2* 8.8    Studies/Results: Dg Cholangiogram Operative  04/08/2012  *RADIOLOGY REPORT*  Clinical Data:     Cholelithiasis.  INTRAOPERATIVE CHOLANGIOGRAM  Technique:  Cholangiographic images from the C-arm fluoroscopic device were submitted for interpretation post-operatively.  Please see the procedural report for the amount of contrast and the fluoroscopy time utilized.  Comparison:  Ultrasound of the abdomen 03/09/2012 and 04/05/2012.  Findings:  The gallbladder has been removed and the cystic duct cannulated.  There is good filling of the common hepatic duct, cystic duct, and common bile duct.  The biliary tree appears dilated.  There is a meniscus at the distal CBD.  There is no passage of contrast into the duodenum.  IMPRESSION: Distended biliary tree.  Suspect distal CBD stones.  No passage of contrast into the duodenum.   Original Report Authenticated By: Davonna Belling, M.D.     Assessment/Plan: POD 1 lap chole  Doing well, needs ercp today  [facility] 04/09/2012

## 2012-04-09 NOTE — Telephone Encounter (Signed)
LMOM for patient to call Pattricia Boss back to r/s her apt from dr Magnus Ivan to dr Dwain Sarna

## 2012-04-09 NOTE — Anesthesia Postprocedure Evaluation (Signed)
Anesthesia Post Note  Patient: Sheryl Howell  Procedure(s) Performed: Procedure(s) (LRB): ENDOSCOPIC RETROGRADE CHOLANGIOPANCREATOGRAPHY (ERCP) (N/A)  Anesthesia type: General  Patient location: PACU  Post pain: Pain level controlled  Post assessment: Post-op Vital signs reviewed  Last Vitals:  Filed Vitals:   04/09/12 1447  BP: 121/73  Pulse:   Temp:   Resp: 16    Post vital signs: Reviewed  Level of consciousness: sedated  Complications: No apparent anesthesia complications

## 2012-04-10 ENCOUNTER — Encounter (HOSPITAL_BASED_OUTPATIENT_CLINIC_OR_DEPARTMENT_OTHER): Admission: RE | Payer: Self-pay | Source: Ambulatory Visit

## 2012-04-10 ENCOUNTER — Ambulatory Visit (HOSPITAL_BASED_OUTPATIENT_CLINIC_OR_DEPARTMENT_OTHER): Admission: RE | Admit: 2012-04-10 | Payer: BC Managed Care – PPO | Source: Ambulatory Visit | Admitting: Surgery

## 2012-04-10 ENCOUNTER — Encounter (HOSPITAL_COMMUNITY): Payer: Self-pay | Admitting: Internal Medicine

## 2012-04-10 HISTORY — DX: Adverse effect of unspecified anesthetic, initial encounter: T41.45XA

## 2012-04-10 HISTORY — DX: Nausea with vomiting, unspecified: Z98.890

## 2012-04-10 HISTORY — DX: Nausea: R11.0

## 2012-04-10 HISTORY — DX: Nausea with vomiting, unspecified: R11.2

## 2012-04-10 HISTORY — DX: Other specified postprocedural states: R11.2

## 2012-04-10 LAB — COMPREHENSIVE METABOLIC PANEL
BUN: 5 mg/dL — ABNORMAL LOW (ref 6–23)
CO2: 28 mEq/L (ref 19–32)
Chloride: 103 mEq/L (ref 96–112)
Creatinine, Ser: 0.65 mg/dL (ref 0.50–1.10)

## 2012-04-10 SURGERY — LAPAROSCOPIC CHOLECYSTECTOMY
Anesthesia: General

## 2012-04-10 MED ORDER — OXYCODONE-ACETAMINOPHEN 5-325 MG PO TABS: 1.0000 | ORAL_TABLET | ORAL | Status: DC | PRN

## 2012-04-10 NOTE — Discharge Summary (Signed)
Physician Discharge Summary  Patient ID: Sheryl Howell MRN: [id number] DOB/AGE: 1955-05-17 57 y.o.  Admit date: 04/07/2012 Discharge date: 04/10/2012  Admission Diagnoses: Acute cholecystitis  Discharge Diagnoses:  Principal Problem:  *Cholelithiasis and acute cholecystitis with obstruction Active Problems:  Abnormal serum lipase level  Common biliary duct obstruction  Choledocholithiasis  Hx of type B viral hepatitis  Hx of seizure disorder  Hx of anxiety disorder  Anemia   Discharged Condition: good  Hospital Course: This is a 57 year old female who presented with gallstones and what appeared to clinically be cholecystitis. Her bilirubin was also elevated. This remained stable the following day and I took her for a laparoscopic cholecystectomy with cholangiogram. She underwent laparoscopic cholecystectomy but her cholangiogram was not able to fill the duodenum and she had a lot of stones and debris in her duct. The first postoperative day she underwent an ERCP by Dr. Leone Payor which cleared her duct. The following day her bilirubin is gone from 4.5 to 2. She feels well, tolerating a regular diet, and is ready for discharge home.  Consults: GI  Significant Diagnostic Studies: none  Treatments: surgery: lap chole with cholangiogram, ercp    Disposition: 01-Home or Self Care    Medication List     As of 04/10/2012  7:57 AM    TAKE these medications         diclofenac sodium 1 % Gel   Commonly known as: VOLTAREN   Apply 2 g topically 4 (four) times daily as needed. For pain.      fish oil-omega-3 fatty acids 1000 MG capsule   Take 1 g by mouth daily.      multivitamin with minerals Tabs   Take 1 tablet by mouth daily.      ondansetron 8 MG disintegrating tablet   Commonly known as: ZOFRAN-ODT   Take 1 tablet (8 mg total) by mouth every 8 (eight) hours as needed for nausea.      oxyCODONE-acetaminophen 5-325 MG per tablet   Commonly known as: PERCOCET/ROXICET   Take 1 tablet by mouth every 4 (four) hours as needed.      venlafaxine XR 75 MG 24 hr capsule   Commonly known as: EFFEXOR-XR   Take 75 mg by mouth daily.           Follow-up Information    Follow up with [facility], [place]. In 2 weeks.   Contact information:   [address] [place] [address] [phone]          Signed: Emelia Loron 04/10/2012, 7:57 AM

## 2012-04-10 NOTE — Progress Notes (Signed)
1 Day Post-Op  Subjective: Feels great, tol diet, no pain, wants to go home  Objective: Vital signs in last 24 hours: Temp:  [98.1 F (36.7 C)-99.3 F (37.4 C)] 98.7 F (37.1 C) (01/24 0550) Pulse Rate:  [62-89] 62  (01/24 0550) Resp:  [14-18] 16  (01/24 0550) BP: (106-131)/(55-77) 129/65 mmHg (01/24 0550) SpO2:  [91 %-98 %] 92 % (01/24 0550) Last BM Date: 04/01/12  Intake/Output from previous day: 01/23 0701 - 01/24 0700 In: 2215 [P.O.:240; I.V.:1975] Out: 3300 [Urine:3300] Intake/Output this shift:    General appearance: no distress GI: soft approp tender, small ecchymosis at umbilicus no infection  Lab Results:   Basename 04/07/12 1551  WBC 9.0  HGB 14.8  HCT 41.4  PLT 215   BMET  Basename 04/10/12 0405 04/09/12 0400  NA 138 138  K 3.3* 3.7  CL 103 103  CO2 28 28  GLUCOSE 100* 103*  BUN 5* 4*  CREATININE 0.65 0.63  CALCIUM 8.3* 8.2*   PT/INR No results found for this basename: LABPROT:2,INR:2 in the last 72 hours ABG No results found for this basename: PHART:2,PCO2:2,PO2:2,HCO3:2 in the last 72 hours  Studies/Results: Dg Cholangiogram Operative  04/08/2012  *RADIOLOGY REPORT*  Clinical Data:     Cholelithiasis.  INTRAOPERATIVE CHOLANGIOGRAM  Technique:  Cholangiographic images from the C-arm fluoroscopic device were submitted for interpretation post-operatively.  Please see the procedural report for the amount of contrast and the fluoroscopy time utilized.  Comparison:  Ultrasound of the abdomen 03/09/2012 and 04/05/2012.  Findings:  The gallbladder has been removed and the cystic duct cannulated.  There is good filling of the common hepatic duct, cystic duct, and common bile duct.  The biliary tree appears dilated.  There is a meniscus at the distal CBD.  There is no passage of contrast into the duodenum.  IMPRESSION: Distended biliary tree.  Suspect distal CBD stones.  No passage of contrast into the duodenum.   Original Report Authenticated By: Davonna Belling,  M.D.    Dg Ercp With Sphincterotomy  04/09/2012  *RADIOLOGY REPORT*  Clinical Data: CBD stones.  ERCP  Comparison:  Intraoperative cholangiogram 04/08/2012  Technique:  Multiple spot images obtained with the fluoroscopic device and submitted for interpretation post-procedure.  ERCP was performed by Dr. Leone Payor.  Findings: Multiple images are submitted from ERCP.  Initial imaging demonstrates small filling defects within the opacified common bile duct which is mildly prominent.  Images demonstrate sweeping of the duct with a balloon.  Final images show no visible residual filling defects.  IMPRESSION: ERCP as above.  These images were submitted for radiologic interpretation only. Please see the procedural report for the amount of contrast and the fluoroscopy time utilized.   Original Report Authenticated By: Charlett Nose, M.D.     Anti-infectives: Anti-infectives     Start     Dose/Rate Route Frequency Ordered Stop   04/08/12 1800   ceFAZolin (ANCEF) IVPB 2 g/50 mL premix  Status:  Discontinued        2 g 100 mL/hr over 30 Minutes Intravenous 3 times per day 04/08/12 1400 04/09/12 1448   04/08/12 1045   ceFAZolin (ANCEF) IVPB 2 g/50 mL premix  Status:  Discontinued        2 g 100 mL/hr over 30 Minutes Intravenous On call to O.R. 04/08/12 1031 04/08/12 1202   04/07/12 1800   cefOXitin (MEFOXIN) 1 g in dextrose 5 % 50 mL IVPB  Status:  Discontinued  1 g 100 mL/hr over 30 Minutes Intravenous 4 times per day 04/07/12 1634 04/08/12 1401          Assessment/Plan: POD 2 lap chole, POD 1 ercp  Bili down appropriately, will dc today with f/u  [facility] 04/10/2012

## 2012-04-10 NOTE — Progress Notes (Signed)
[facility] Progress Note  SUBJECTIVE: feels okay, no complaints.  OBJECTIVE:  Vital signs in last 24 hours: Temp:  [98.1 F (36.7 C)-99.3 F (37.4 C)] 98.7 F (37.1 C) (01/24 0550) Pulse Rate:  [62-89] 62  (01/24 0550) Resp:  [14-18] 16  (01/24 0550) BP: (106-131)/(55-77) 129/65 mmHg (01/24 0550) SpO2:  [91 %-98 %] 92 % (01/24 0550) Last BM Date: 04/01/12 General:    white female in NAD Heart:  Regular rate and rhythm; no murmurs Lungs: Respirations even and unlabored, lungs CTA bilaterally Abdomen:  Soft, nontender and nondistended. Normal bowel sounds. Extremities:  Without edema. Neurologic:  Alert and oriented,  grossly normal neurologically. Psych:  Cooperative. Normal mood and affect.     Lab Results:  [facility] 04/07/12 1551  WBC 9.0  HGB 14.8  HCT 41.4  PLT 215   BMET  Basename 04/10/12 0405 04/09/12 0400 04/08/12 0435  NA 138 138 138  K 3.3* 3.7 3.7  CL 103 103 103  CO2 28 28 25   GLUCOSE 100* 103* 100*  BUN 5* 4* 5*  CREATININE 0.65 0.63 0.69  CALCIUM 8.3* 8.2* 8.8   LFT  Basename 04/10/12 0405  PROT 5.5*  ALBUMIN 2.7*  AST 63*  ALT 139*  ALKPHOS 182*  BILITOT 2.0*  BILIDIR --  IBILI --    Studies/Results: Dg Cholangiogram Operative  04/08/2012  *RADIOLOGY REPORT*  Clinical Data:     Cholelithiasis.  INTRAOPERATIVE CHOLANGIOGRAM  Technique:  Cholangiographic images from the C-arm fluoroscopic device were submitted for interpretation post-operatively.  Please see the procedural report for the amount of contrast and the fluoroscopy time utilized.  Comparison:  Ultrasound of the abdomen 03/09/2012 and 04/05/2012.  Findings:  The gallbladder has been removed and the cystic duct cannulated.  There is good filling of the common hepatic duct, cystic duct, and common bile duct.  The biliary tree appears dilated.  There is a meniscus at the distal CBD.  There is no passage of contrast into the duodenum.  IMPRESSION: Distended biliary tree.   Suspect distal CBD stones.  No passage of contrast into the duodenum.   Original Report Authenticated By: Davonna Belling, M.D.    Dg Ercp With Sphincterotomy  04/09/2012  *RADIOLOGY REPORT*  Clinical Data: CBD stones.  ERCP  Comparison:  Intraoperative cholangiogram 04/08/2012  Technique:  Multiple spot images obtained with the fluoroscopic device and submitted for interpretation post-procedure.  ERCP was performed by Dr. Leone Payor.  Findings: Multiple images are submitted from ERCP.  Initial imaging demonstrates small filling defects within the opacified common bile duct which is mildly prominent.  Images demonstrate sweeping of the duct with a balloon.  Final images show no visible residual filling defects.  IMPRESSION: ERCP as above.  These images were submitted for radiologic interpretation only. Please see the procedural report for the amount of contrast and the fluoroscopy time utilized.   Original Report Authenticated By: Charlett Nose, M.D.      ASSESSMENT / PLAN:  Choledocholithiasis, s/p ERCP with stone extraction and sphincterotomy yesterday. LFTs improved today. Her abdominal pain has resolved. Surgery has seen and is discharging her home today   LOS: 3 days   Willette Cluster  04/10/2012, 9:49 AM

## 2012-04-10 NOTE — Progress Notes (Signed)
Agree 

## 2012-04-13 ENCOUNTER — Encounter (INDEPENDENT_AMBULATORY_CARE_PROVIDER_SITE_OTHER): Payer: 59 | Admitting: Surgery

## 2012-04-30 ENCOUNTER — Encounter: Payer: Self-pay | Admitting: Family Medicine

## 2012-05-02 ENCOUNTER — Other Ambulatory Visit: Payer: Self-pay

## 2012-05-05 ENCOUNTER — Encounter (INDEPENDENT_AMBULATORY_CARE_PROVIDER_SITE_OTHER): Payer: Self-pay | Admitting: General Surgery

## 2012-05-05 ENCOUNTER — Ambulatory Visit (INDEPENDENT_AMBULATORY_CARE_PROVIDER_SITE_OTHER): Payer: BC Managed Care – PPO | Admitting: General Surgery

## 2012-05-05 VITALS — BP 100/60 | HR 70 | Resp 18 | Ht 62.0 in | Wt 151.0 lb

## 2012-05-05 DIAGNOSIS — Z09 Encounter for follow-up examination after completed treatment for conditions other than malignant neoplasm: Secondary | ICD-10-CM

## 2012-05-05 NOTE — Progress Notes (Signed)
Subjective:     Patient ID: Sheryl Howell, female   DOB: 11/28/1955, 57 y.o.   MRN: [id number]  HPI 60 yof admitted with choledocholithiasis.  Ended up going to or for lap chole with cholangiogram that didn't fill duodenum.  She then underwent postop ercp that cleared her duct.  She is doing well and comes in today without complaint.  She has some urgency with stool after eating butter and her energy is slowly returning.  Review of Systems     Objective:   Physical Exam Healing incisions without infection    Assessment:     S/p lap chole, ercp for choledocholithiasis    Plan:     She is doing well and released to full activity. I will see back as needed.

## 2012-05-11 ENCOUNTER — Encounter (INDEPENDENT_AMBULATORY_CARE_PROVIDER_SITE_OTHER): Payer: BC Managed Care – PPO | Admitting: Surgery

## 2012-05-12 ENCOUNTER — Encounter (INDEPENDENT_AMBULATORY_CARE_PROVIDER_SITE_OTHER): Payer: BC Managed Care – PPO | Admitting: Surgery

## 2012-05-12 ENCOUNTER — Encounter (INDEPENDENT_AMBULATORY_CARE_PROVIDER_SITE_OTHER): Payer: BC Managed Care – PPO | Admitting: General Surgery

## 2012-06-05 ENCOUNTER — Encounter: Payer: Self-pay | Admitting: Family Medicine

## 2012-06-05 ENCOUNTER — Telehealth: Payer: Self-pay | Admitting: *Deleted

## 2012-06-05 ENCOUNTER — Ambulatory Visit (INDEPENDENT_AMBULATORY_CARE_PROVIDER_SITE_OTHER): Payer: BC Managed Care – PPO | Admitting: Family Medicine

## 2012-06-05 VITALS — BP 109/68 | HR 73 | Temp 98.1°F | Ht 62.0 in | Wt 153.0 lb

## 2012-06-05 DIAGNOSIS — R928 Other abnormal and inconclusive findings on diagnostic imaging of breast: Secondary | ICD-10-CM

## 2012-06-05 DIAGNOSIS — N63 Unspecified lump in unspecified breast: Secondary | ICD-10-CM

## 2012-06-05 DIAGNOSIS — N6459 Other signs and symptoms in breast: Secondary | ICD-10-CM | POA: Insufficient documentation

## 2012-06-05 NOTE — Assessment & Plan Note (Addendum)
New nodularity noted by patient this morning on left breast.  Due for screening mammogram.  Will order  Diagnostic mammo and left ultrasound for further evaluation.

## 2012-06-05 NOTE — Telephone Encounter (Signed)
Patient calls requesting order for for [facility]. States last year at her screening mammogram there   was a concern and she had ultrasound . Now she calls today stating she has found a lump. Patient 's last office visiit  here was 05/27/2011.  Consulted with Dr. Earnest Bailey and she advises patient should come in first to be examined and then appriorate test can be ordered.  Patient is not receptive to this idea stating she doesn't understand why this is needed. She feels it would be best to have mammogram first . However appointment is made today with Dr. Earnest Bailey.

## 2012-06-05 NOTE — Patient Instructions (Addendum)
See appointment for breast exam Make annual physical with your primary doctor at your convenience

## 2012-06-05 NOTE — Progress Notes (Signed)
  Subjective:    Patient ID: Sheryl Howell, female    DOB: 12/21/1955, 57 y.o.   MRN: [id number]  HPI57 yo with history of dense breast tissue here for evaluation of new nodulairty on left breast  No pain, nipple discharge, skin changes or changes in sensation.    Was noticed by patient this morning as part of routine self breast exam  I have reviewed patient's  PMH, FH, and Social history and Medications as related to this visit. History of aunt with breast ca Review of Systems See HPI    Objective:   Physical Exam GEN; NAD Breasts:  Symmetrical, skin and nipple normal without discharge.  No pain on palpation. Dense breasts bilaterally. Some nodularity noted at 6 oclock position at border of areola       Assessment & Plan:

## 2012-06-09 ENCOUNTER — Ambulatory Visit
Admission: RE | Admit: 2012-06-09 | Discharge: 2012-06-09 | Disposition: A | Discharge status: Home / Self Care | Payer: BC Managed Care – PPO | Source: Ambulatory Visit | Attending: Family Medicine | Admitting: Family Medicine

## 2012-06-09 DIAGNOSIS — N63 Unspecified lump in unspecified breast: Secondary | ICD-10-CM

## 2012-12-13 ENCOUNTER — Other Ambulatory Visit: Payer: Self-pay | Admitting: Family Medicine

## 2013-01-21 ENCOUNTER — Other Ambulatory Visit: Payer: Self-pay

## 2013-03-09 ENCOUNTER — Other Ambulatory Visit: Payer: Self-pay | Admitting: Family Medicine

## 2013-03-12 ENCOUNTER — Telehealth: Payer: Self-pay | Admitting: Family Medicine

## 2013-03-12 MED ORDER — VENLAFAXINE HCL ER 75 MG PO CP24: 75.0000 mg | ORAL_CAPSULE | Freq: Every day | ORAL | Status: DC

## 2013-03-12 NOTE — Telephone Encounter (Signed)
After hours line  Patient called requesting refill on effexor. She states she requested it earlier in teh week but but never received a response. She will be leaving town tomorrow and has withdrawal symptoms when she runs out after 1 day. She will be out 3 days from now.  I explained that it is not our usual practice but given the circumstances I will refill her medication with 0 refills and leave the future refills to her PCP.  Murtis Sink, MD [facility] Resident, PGY-2 03/12/2013, 3:16 PM

## 2013-03-24 ENCOUNTER — Encounter: Payer: Self-pay | Admitting: Family Medicine

## 2013-03-24 ENCOUNTER — Ambulatory Visit (INDEPENDENT_AMBULATORY_CARE_PROVIDER_SITE_OTHER): Payer: BC Managed Care – PPO | Admitting: Family Medicine

## 2013-03-24 VITALS — BP 123/84 | HR 67 | Temp 98.7°F | Ht 62.0 in | Wt 161.0 lb

## 2013-03-24 DIAGNOSIS — L723 Sebaceous cyst: Secondary | ICD-10-CM

## 2013-03-24 MED ORDER — VENLAFAXINE HCL ER 75 MG PO CP24: 75.0000 mg | ORAL_CAPSULE | Freq: Every day | ORAL | Status: DC

## 2013-03-24 NOTE — Assessment & Plan Note (Signed)
Mass on back of head likely sebaceous cyst. History of prior removal and recurrence 10 years ago. Patient would like it removed; no openings on my schedule so will try to get her scheduled with the derm procedure clinic.

## 2013-03-24 NOTE — Progress Notes (Signed)
   Subjective:    Patient ID: Sheryl Howell, femaleRenata Howell    DOB: Mar 13, 1956, 58 y.o.   MRN: [id number]  HPI  # Medication refill - need refill for effexor - no recent complaints of depression, denies SI/HI - has been stable on medication for the last few years - thinks she gets aggitated when she misses doses  # Bump on back of head - previously cut out/removed 10 years ago, but recurred 1-2 years after - not painful, though causes discomfort when she is laying in bed - no itching - thinks it has steadily gotten bigger in size  Review of Systems  Constitutional: Negative for activity change.  HENT: Negative for rhinorrhea.   Respiratory: Negative for chest tightness and shortness of breath.   Cardiovascular: Negative for chest pain.  Gastrointestinal: Negative for abdominal pain.  Genitourinary: Negative for dysuria.       Objective:   Physical Exam BP 123/84  Pulse 67  Temp(Src) 98.7 F (37.1 C) (Oral)  Ht 5\' 2"  (1.575 m)  Wt 161 lb (73.029 kg)  BMI 29.44 kg/m2  General: NAD HEENT: NCAT, PERRL, EOMI. 1x0.5cm firm, mobile lump on back right of occiput. Nontender. CV: RRR, normal heart sounds, no murmurs Resp: CTAB, normal effort Ext: no edema or cyanosis      Assessment & Plan:  See Problem List documentation

## 2013-03-24 NOTE — Patient Instructions (Signed)
It was nice to meet you today.  Schedule an appointment with the PROCEDURE CLINIC at the front desk.  If they are unable to schedule this with the procedure clinic, please have them schedule a 30 minute block with the provider.

## 2013-04-22 ENCOUNTER — Encounter: Payer: Self-pay | Admitting: Family Medicine

## 2013-04-22 ENCOUNTER — Ambulatory Visit: Payer: BC Managed Care – PPO

## 2013-04-22 ENCOUNTER — Ambulatory Visit (INDEPENDENT_AMBULATORY_CARE_PROVIDER_SITE_OTHER): Payer: BC Managed Care – PPO | Admitting: Family Medicine

## 2013-04-22 VITALS — BP 130/93 | HR 68 | Ht 63.0 in | Wt 157.2 lb

## 2013-04-22 DIAGNOSIS — L723 Sebaceous cyst: Secondary | ICD-10-CM

## 2013-04-22 NOTE — Progress Notes (Signed)
Patient ID: Renata CapriceMaryann Horsley, female   DOB: [birth year], 58 y.o.   MRN: [id number] Patient is here for removal of sebaceous cyst on her scalp. She's had it removed before and Stadol for about 2 years and then returned. It's now been there several years. She states that when she's, her hair it becomes irritated.  PROCEDURE NOTE: Patient given informed consent signed copy is in the chart. Area prepped and draped in usual sterile fashion. Local anesthesia was obtained with 3 cc of 1% lidocaine with epinephrine. After anesthesia was obtained, a 8 mm simple incision was made over the area of the cyst. There was leakage of a small amount cyst material and then the typical sebaceous cyst capsule and exudate were removed using curved forceps. Her mood is much of the sac as I could see. There is minimal amount of bleeding. I used Dermabond to close a simple incision.  Post procedure structures included no need for suture removal. Watch for signs of infection such as redness fever pain or drainage. Otherwise she needs no further followup for this. She is warned that since he came back once before it will likely come back again. Hope it's several years. To contact us with questions.

## 2013-05-28 ENCOUNTER — Encounter: Payer: Self-pay | Admitting: Family Medicine

## 2013-05-28 ENCOUNTER — Ambulatory Visit (INDEPENDENT_AMBULATORY_CARE_PROVIDER_SITE_OTHER): Payer: BC Managed Care – PPO | Admitting: Family Medicine

## 2013-05-28 VITALS — BP 114/81 | HR 62 | Temp 97.5°F | Ht 63.0 in | Wt 158.9 lb

## 2013-05-28 DIAGNOSIS — J019 Acute sinusitis, unspecified: Secondary | ICD-10-CM

## 2013-05-28 MED ORDER — AMOXICILLIN-POT CLAVULANATE 875-125 MG PO TABS: 1.0000 | ORAL_TABLET | Freq: Two times a day (BID) | ORAL | Status: DC

## 2013-05-28 NOTE — Progress Notes (Signed)
Subjective:     Patient ID: Sheryl CapriceMaryann Howell, female   DOB: 12-16-1955, 58 y.o.   MRN: [id number]  HPI  58 yo here for ?sinus infection  - 1 month ago had a cold and seemed to linger but got better - 1.5 weeks ago seemed to have symptoms returning - noticed more pressure in her sinuses - ears are hurting bilaterally - don't feel plugged - face is tender.  - having some adenopathy  Has been doing nasal steroid for the last 5 days - taking claritin  No fevers, chills, nausea, vomiting, SOB, chest pain.    Review of Systems See above    Objective:   Physical Exam  Constitutional: She appears well-developed and well-nourished.  HENT:  Head: Normocephalic and atraumatic.  Right Ear: Hearing, tympanic membrane, external ear and ear canal normal.  Left Ear: Hearing, tympanic membrane, external ear and ear canal normal.  Nose: Mucosal edema present. Right sinus exhibits maxillary sinus tenderness. Right sinus exhibits no frontal sinus tenderness. Left sinus exhibits maxillary sinus tenderness. Left sinus exhibits no frontal sinus tenderness.  Mouth/Throat: Oropharynx is clear and moist and mucous membranes are normal. No oropharyngeal exudate or posterior oropharyngeal edema.  Cardiovascular: Normal rate and regular rhythm.   Pulmonary/Chest: Effort normal and breath sounds normal. She has no wheezes. She has no rales.  Abdominal: Soft.  Lymphadenopathy:    She has cervical adenopathy.       Assessment:     Acute sinusitis      Plan:     Long duration and persistent symptoms and worsening after initial improvement concerning for bacterial infection - rx of augmentin x 10 days - cont steroid nasal spray - f/u if no improvement or worsening.

## 2013-05-28 NOTE — Patient Instructions (Signed)

## 2013-06-15 ENCOUNTER — Other Ambulatory Visit: Payer: Self-pay

## 2013-06-15 DIAGNOSIS — Z1231 Encounter for screening mammogram for malignant neoplasm of breast: Secondary | ICD-10-CM

## 2013-06-16 ENCOUNTER — Ambulatory Visit
Admission: RE | Admit: 2013-06-16 | Discharge: 2013-06-16 | Disposition: A | Discharge status: Home / Self Care | Payer: BC Managed Care – PPO | Source: Ambulatory Visit

## 2013-06-16 DIAGNOSIS — Z1231 Encounter for screening mammogram for malignant neoplasm of breast: Secondary | ICD-10-CM

## 2013-07-01 ENCOUNTER — Encounter: Payer: Self-pay | Admitting: Family Medicine

## 2013-07-14 ENCOUNTER — Telehealth: Payer: Self-pay | Admitting: Family Medicine

## 2013-07-14 NOTE — Telephone Encounter (Signed)
Needs refill on voltaren gel

## 2013-07-15 ENCOUNTER — Ambulatory Visit (INDEPENDENT_AMBULATORY_CARE_PROVIDER_SITE_OTHER): Payer: BC Managed Care – PPO | Admitting: Psychiatry

## 2013-07-15 DIAGNOSIS — F4323 Adjustment disorder with mixed anxiety and depressed mood: Secondary | ICD-10-CM

## 2013-07-15 DIAGNOSIS — Z630 Problems in relationship with spouse or partner: Secondary | ICD-10-CM

## 2013-07-15 MED ORDER — DICLOFENAC SODIUM 1 % TD GEL: 2.0000 g | Freq: Four times a day (QID) | TRANSDERMAL | Status: DC | PRN

## 2013-07-16 ENCOUNTER — Encounter: Payer: Self-pay | Admitting: *Deleted

## 2013-07-16 NOTE — Progress Notes (Signed)
[facility]taren 1% gel.  PA form placed in provider box for review.  Clovis Puamika L Morrissa Shein, RN

## 2013-07-20 NOTE — Progress Notes (Signed)
PA form faxed to [facility] for review.  Clovis Puamika L Jailyne Chieffo, RN

## 2013-07-23 NOTE — Progress Notes (Signed)
Prior authorization for Voltaren 1% gel has been approved through [facility] effective 06/21/2013 through 07/21/2014.  Case number [id number].  Clovis Puamika L Leighanne Adolph, RN

## 2013-08-04 ENCOUNTER — Ambulatory Visit (INDEPENDENT_AMBULATORY_CARE_PROVIDER_SITE_OTHER): Payer: BC Managed Care – PPO | Admitting: Psychiatry

## 2013-08-04 DIAGNOSIS — Z630 Problems in relationship with spouse or partner: Secondary | ICD-10-CM

## 2013-08-04 DIAGNOSIS — F4323 Adjustment disorder with mixed anxiety and depressed mood: Secondary | ICD-10-CM

## 2013-08-10 ENCOUNTER — Ambulatory Visit (HOSPITAL_COMMUNITY): Payer: BC Managed Care – PPO | Admitting: Marriage and Family Therapist

## 2013-08-18 ENCOUNTER — Ambulatory Visit: Payer: BC Managed Care – PPO | Admitting: Psychiatry

## 2013-08-19 ENCOUNTER — Ambulatory Visit (INDEPENDENT_AMBULATORY_CARE_PROVIDER_SITE_OTHER): Payer: BC Managed Care – PPO | Admitting: Psychiatry

## 2013-08-19 DIAGNOSIS — Z630 Problems in relationship with spouse or partner: Secondary | ICD-10-CM

## 2013-08-19 DIAGNOSIS — Z7189 Other specified counseling: Secondary | ICD-10-CM

## 2013-08-19 DIAGNOSIS — F4323 Adjustment disorder with mixed anxiety and depressed mood: Secondary | ICD-10-CM

## 2013-08-31 ENCOUNTER — Ambulatory Visit (HOSPITAL_COMMUNITY): Payer: BC Managed Care – PPO | Admitting: Marriage and Family Therapist

## 2013-09-02 ENCOUNTER — Ambulatory Visit (INDEPENDENT_AMBULATORY_CARE_PROVIDER_SITE_OTHER): Payer: BC Managed Care – PPO | Admitting: Psychiatry

## 2013-09-02 DIAGNOSIS — Z630 Problems in relationship with spouse or partner: Secondary | ICD-10-CM

## 2013-09-02 DIAGNOSIS — F4323 Adjustment disorder with mixed anxiety and depressed mood: Secondary | ICD-10-CM

## 2013-09-09 ENCOUNTER — Ambulatory Visit (INDEPENDENT_AMBULATORY_CARE_PROVIDER_SITE_OTHER): Payer: BC Managed Care – PPO | Admitting: Psychiatry

## 2013-09-09 DIAGNOSIS — F4323 Adjustment disorder with mixed anxiety and depressed mood: Secondary | ICD-10-CM

## 2013-09-09 DIAGNOSIS — Z7189 Other specified counseling: Secondary | ICD-10-CM

## 2013-09-09 DIAGNOSIS — Z630 Problems in relationship with spouse or partner: Secondary | ICD-10-CM

## 2013-09-21 ENCOUNTER — Ambulatory Visit (INDEPENDENT_AMBULATORY_CARE_PROVIDER_SITE_OTHER): Payer: BC Managed Care – PPO | Admitting: Psychiatry

## 2013-09-21 DIAGNOSIS — F4323 Adjustment disorder with mixed anxiety and depressed mood: Secondary | ICD-10-CM

## 2013-09-21 DIAGNOSIS — Z630 Problems in relationship with spouse or partner: Secondary | ICD-10-CM

## 2013-10-05 ENCOUNTER — Ambulatory Visit (INDEPENDENT_AMBULATORY_CARE_PROVIDER_SITE_OTHER): Payer: BC Managed Care – PPO | Admitting: Psychiatry

## 2013-10-05 DIAGNOSIS — Z630 Problems in relationship with spouse or partner: Secondary | ICD-10-CM

## 2013-10-05 DIAGNOSIS — F4323 Adjustment disorder with mixed anxiety and depressed mood: Secondary | ICD-10-CM

## 2013-10-07 ENCOUNTER — Other Ambulatory Visit: Payer: Self-pay | Admitting: Family Medicine

## 2013-10-19 ENCOUNTER — Ambulatory Visit (INDEPENDENT_AMBULATORY_CARE_PROVIDER_SITE_OTHER): Payer: BC Managed Care – PPO | Admitting: Psychiatry

## 2013-10-19 DIAGNOSIS — Z630 Problems in relationship with spouse or partner: Secondary | ICD-10-CM

## 2013-10-19 DIAGNOSIS — F4323 Adjustment disorder with mixed anxiety and depressed mood: Secondary | ICD-10-CM

## 2013-11-02 ENCOUNTER — Ambulatory Visit (INDEPENDENT_AMBULATORY_CARE_PROVIDER_SITE_OTHER): Payer: BC Managed Care – PPO | Admitting: Psychiatry

## 2013-11-02 DIAGNOSIS — Z630 Problems in relationship with spouse or partner: Secondary | ICD-10-CM

## 2013-11-02 DIAGNOSIS — Z7189 Other specified counseling: Secondary | ICD-10-CM

## 2013-11-02 DIAGNOSIS — F4323 Adjustment disorder with mixed anxiety and depressed mood: Secondary | ICD-10-CM

## 2013-11-04 ENCOUNTER — Other Ambulatory Visit: Payer: Self-pay | Admitting: Family Medicine

## 2013-11-25 ENCOUNTER — Ambulatory Visit (INDEPENDENT_AMBULATORY_CARE_PROVIDER_SITE_OTHER): Payer: BC Managed Care – PPO | Admitting: Psychiatry

## 2013-11-25 DIAGNOSIS — Z7189 Other specified counseling: Secondary | ICD-10-CM

## 2013-11-25 DIAGNOSIS — Z630 Problems in relationship with spouse or partner: Secondary | ICD-10-CM

## 2013-11-25 DIAGNOSIS — F4323 Adjustment disorder with mixed anxiety and depressed mood: Secondary | ICD-10-CM

## 2013-12-06 ENCOUNTER — Ambulatory Visit (INDEPENDENT_AMBULATORY_CARE_PROVIDER_SITE_OTHER): Payer: BC Managed Care – PPO | Admitting: Family Medicine

## 2013-12-06 ENCOUNTER — Encounter: Payer: Self-pay | Admitting: Family Medicine

## 2013-12-06 VITALS — BP 104/68 | HR 72 | Temp 98.4°F | Ht 63.0 in | Wt 161.9 lb

## 2013-12-06 DIAGNOSIS — F330 Major depressive disorder, recurrent, mild: Secondary | ICD-10-CM | POA: Diagnosis not present

## 2013-12-06 MED ORDER — CITALOPRAM HYDROBROMIDE 20 MG PO TABS: 20.0000 mg | ORAL_TABLET | Freq: Every day | ORAL | Status: DC

## 2013-12-06 NOTE — Patient Instructions (Signed)
Taking the medicine as directed and not missing any doses is one of the best things you can do to treat your depression.  Here are some things to keep in mind:  1) Side effects (stomach upset, some increased anxiety) may happen before you notice a benefit.  These side effects typically go away over time. 2) Changes to your dose of medicine or a change in medication all together is sometimes necessary 3) Most people need to be on medication at least 6-12 months 4) Many people will notice an improvement within two weeks but the full effect of the medication can take up to 4-6 weeks 5) Stopping the medication when you start feeling better often results in a return of symptoms 6) If you start having thoughts of hurting yourself or others after starting this medicine, please call me at [phone] immediately.    Thanks, Dr. Elroy Schembri  

## 2013-12-06 NOTE — Progress Notes (Signed)
Subjective:   Sheryl Howell is an 58 y.o. female who presents for evaluation and treatment of depressive symptoms.  Onset approximately 5 years ago, unchanged since that time.  Current symptoms include depressed mood, impaired memory, anxiety, loss of energy/fatigue,.  Current treatment for depression:Individual therapy and Medication Sleep problems: Mild   Early awakening:Absent   Energy: Limited Motivation: Limited Concentration: Fair Rumination/worrying: Moderate Memory: Good Tearfulness: Absent  Anxiety: Moderate  Panic: Absent  Overall Mood: No change  Hopelessness: Absent Suicidal ideation: Absent  Other/Psychosocial Stressors: Son is drug addict, recently arrested and daughter has Borderline Personality Disorder Family history positive for depression in the patient's unknown.  Previous treatment modalities employed include Individual therapy and Medication.  Past episodes of depression: Previously on Prozac Organic causes of depression present: None.  Review of Systems Pertinent items are noted in HPI.   Objective:   Mental Status Examination: Posture and motor behavior: Appropriate Dress, grooming, personal hygiene: Appropriate Facial expression: Appropriate Speech: Appropriate Mood: Appropriate Coherency and relevance of thought: Appropriate Thought content: Appropriate Orientation:Appropriate Attention and concentration: Appropriate Memory: : Appropriate  Assessment:   Experiencing the following symptoms of depression most of the day nearly every day for more than two consecutive weeks: depressed mood  Depressive Disorder: Present  Suicide Risk Assessment: Absent   Suicidal intent: Absent Suicidal plan: Absent  Access to means for suicide: Absent Lethality of means for suicide: Absent Prior suicide attempts: none  Recent exposure to suicide:None     Plan:   DEPRESSION, MILD, RECURRENT  Reviewed concept of depression as biochemical  imbalance of neurotransmitters and rationale for treatment. Instructed patient to contact office or on-call physician promptly should condition worsen or any new symptoms appear and provided on-call telephone numbers.

## 2013-12-06 NOTE — Assessment & Plan Note (Signed)
Does not think she is responding to effexor the same way.  Have a feeling this is adjustment disorder (increased stress from addiction in son) but will obtain PHQ 9 today.  Will switch to celexa 20 mg for both GAD and depression and f/u in 2-4 weeks to reassess, at which point can consider increase to 40 mg.

## 2013-12-09 ENCOUNTER — Ambulatory Visit (INDEPENDENT_AMBULATORY_CARE_PROVIDER_SITE_OTHER): Payer: BC Managed Care – PPO | Admitting: Psychiatry

## 2013-12-09 DIAGNOSIS — Z630 Problems in relationship with spouse or partner: Secondary | ICD-10-CM

## 2013-12-09 DIAGNOSIS — F4323 Adjustment disorder with mixed anxiety and depressed mood: Secondary | ICD-10-CM

## 2013-12-21 ENCOUNTER — Ambulatory Visit: Payer: BC Managed Care – PPO | Admitting: Psychiatry

## 2013-12-30 ENCOUNTER — Ambulatory Visit (INDEPENDENT_AMBULATORY_CARE_PROVIDER_SITE_OTHER): Payer: BC Managed Care – PPO | Admitting: Family Medicine

## 2013-12-30 ENCOUNTER — Encounter: Payer: Self-pay | Admitting: Family Medicine

## 2013-12-30 VITALS — BP 102/51 | HR 61 | Wt <= 1120 oz

## 2013-12-30 DIAGNOSIS — J0100 Acute maxillary sinusitis, unspecified: Secondary | ICD-10-CM

## 2013-12-30 DIAGNOSIS — F330 Major depressive disorder, recurrent, mild: Secondary | ICD-10-CM

## 2013-12-30 DIAGNOSIS — J019 Acute sinusitis, unspecified: Secondary | ICD-10-CM | POA: Insufficient documentation

## 2013-12-30 MED ORDER — CITALOPRAM HYDROBROMIDE 40 MG PO TABS
40.0000 mg | ORAL_TABLET | Freq: Every day | ORAL | Status: DC
Start: 2013-12-30 — End: 2014-04-26

## 2013-12-30 NOTE — Assessment & Plan Note (Signed)
Doing well with good response (5/10 improvement from last visit) for GAD + adjustment disorder most likely.  Increase Celexa to 40 mg and f/u in 4-6 weeks.  At which point, if doing well, can f/u every 6-12 months.

## 2013-12-30 NOTE — Patient Instructions (Signed)
Please use the nasal saline 2-3 x per day Please use the Mucinex extended release, 600 mg every 12 hours Please use the nasal steroid you have according to the directions Please stay hydrated, and if not improving by Monday or Tuesday, please call.  Thanks, Dr. Paulina FusiHess

## 2013-12-30 NOTE — Progress Notes (Signed)
Subjective:   Sheryl SellsMaryAnn Courtois-Horsley is an 58 y.o. female who presents for evaluation and treatment of GAD/adjustment disorder Onset approximately 5 years ago, unchanged since that time.  Current symptoms include depressed mood, impaired memory, anxiety, loss of energy/fatigue,.  Current treatment for depression:Individual therapy and Celexa Sleep problems: Improved Early awakening:Improved  Energy: Improved  Motivation: Improved  Concentration: Improved  Tearfulness: Absent  Anxiety: Improved Panic: Absent  Overall Mood: Improved  Hopelessness: Absent Suicidal ideation: Absent  Other/Psychosocial Stressors: Son is drug addict, recently arrested and daughter has Borderline Personality Disorder Family history positive for depression in the patient's unknown.  Previous treatment modalities employed include Individual therapy and Medication (Effexor and Prozac)  Organic causes of depression present: None.  Pt was seen about 2-3 weeks ago and was switched from Effexor to Celexa.  She has done well on this medication, tolerating the drug w/o nausea, vomiting, diarrhea, HA, sleep disturbance or sexual dysfunction.  Her Sx have improved about 50% since last visit and she would like to increase.    Sinusitis - As well, pt c/o 6-7 days of frontal/maxillary sinus pressure that has now affected her ears.  She has tried steam shower but nothing else.  She denies fever or nasal drainage.  Does feel congested but denies any cough or dysphagia.  Has Hx of sinus infection, most recent March 2015.    Review of Systems Pertinent items are noted in HPI.   Objective:   Mental Status Examination: Posture and motor behavior: Appropriate Dress, grooming, personal hygiene: Appropriate Facial expression: Appropriate Speech: Appropriate Mood: Appropriate Coherency and relevance of thought: Appropriate Thought content: Appropriate Orientation:Appropriate Attention and concentration:  Appropriate Memory: : Appropriate  HEENT: [place]/AT, Nasal turbinate edema B/L, no sinus TTP in frontal/maxillary.  MMM, O/P clear.  TMI B/L w/o erythema   Assessment:   GAD/Adjustment Disorder 2) Sinusitis, acute, most likely viral   Suicide Risk Assessment: Absent   Suicidal intent: Absent Suicidal plan: Absent  Access to means for suicide: Absent Lethality of means for suicide: Absent Prior suicide attempts: none  Recent exposure to suicide:None

## 2013-12-30 NOTE — Assessment & Plan Note (Signed)
Most likely viral as afebrile, no nasal d/c, with minimal nasal erythema/edema in the turbinates - Nasal Saline 2-3 x per day - Nasonex or Nasacort spray 2 each nostril x 1 per day - Mucinex 600 mg ES BID - If no improvement in 4-5 days, will call in Augmentin 875 BID x 7-10 days

## 2014-01-13 ENCOUNTER — Ambulatory Visit (INDEPENDENT_AMBULATORY_CARE_PROVIDER_SITE_OTHER): Payer: BC Managed Care – PPO | Admitting: Psychiatry

## 2014-01-13 DIAGNOSIS — F4323 Adjustment disorder with mixed anxiety and depressed mood: Secondary | ICD-10-CM

## 2014-01-25 ENCOUNTER — Ambulatory Visit (INDEPENDENT_AMBULATORY_CARE_PROVIDER_SITE_OTHER): Payer: BC Managed Care – PPO | Admitting: Family Medicine

## 2014-01-25 ENCOUNTER — Encounter: Payer: Self-pay | Admitting: Family Medicine

## 2014-01-25 VITALS — BP 110/57 | HR 56 | Temp 97.9°F | Ht 63.0 in | Wt 161.4 lb

## 2014-01-25 DIAGNOSIS — F330 Major depressive disorder, recurrent, mild: Secondary | ICD-10-CM

## 2014-01-25 NOTE — Progress Notes (Signed)
Subjective:   Sheryl Howell is an 58 y.o. female who presents for evaluation and treatment of GAD/adjustment disorder Onset approximately 5 years ago, unchanged since that time.  Current symptoms include depressed mood, impaired memory, anxiety, loss of energy/fatigue,.  Current treatment for depression:Individual therapy and Celexa Sleep problems: Improved Early awakening:Improved  Energy: Improved  Motivation: Improved  Concentration: Improved  Tearfulness: Absent  Anxiety: Improved Panic: Absent  Overall Mood: Improved  Hopelessness: Absent Suicidal ideation: Absent  Other/Psychosocial Stressors: Son is drug addict, recently arrested and daughter has Borderline Personality Disorder Family history positive for depression in the patient's unknown.  Previous treatment modalities employed include Individual therapy and Medication (Effexor and Prozac)  Organic causes of depression present: None.  Pt was seen about 6 weeks ago and was switched from Effexor to Celexa.  She has done well on this medication, tolerating the drug w/o nausea, vomiting, diarrhea, HA, sleep disturbance or sexual dysfunction.  She was increased on Celexa about 3 weeks ago and has done well since then, improvement of about 20% (70%) overall.    Review of Systems Pertinent items are noted in HPI.   Objective:   Mental Status Examination: Posture and motor behavior: Appropriate Dress, grooming, personal hygiene: Appropriate Facial expression: Appropriate Speech: Appropriate Mood: Appropriate Coherency and relevance of thought: Appropriate Thought content: Appropriate Orientation:Appropriate Attention and concentration: Appropriate Memory: : Appropriate  Assessment:   GAD/Adjustment Disorder  Suicide Risk Assessment: Absent   Suicidal intent: Absent Suicidal plan: Absent  Access to means for suicide: Absent Lethality of means for suicide: Absent Prior suicide attempts: none  Recent  exposure to suicide:None

## 2014-01-25 NOTE — Assessment & Plan Note (Signed)
Doing well with good response (7/10 from initial visit) for GAD + adjustment disorder most likely.  No SI/HI today - Continue with Celexa 40 mg daily - F/U in 3-6 months

## 2014-01-26 ENCOUNTER — Ambulatory Visit (INDEPENDENT_AMBULATORY_CARE_PROVIDER_SITE_OTHER): Payer: BC Managed Care – PPO | Admitting: Psychiatry

## 2014-01-26 DIAGNOSIS — Z7189 Other specified counseling: Secondary | ICD-10-CM

## 2014-01-26 DIAGNOSIS — F4323 Adjustment disorder with mixed anxiety and depressed mood: Secondary | ICD-10-CM

## 2014-02-16 ENCOUNTER — Ambulatory Visit (INDEPENDENT_AMBULATORY_CARE_PROVIDER_SITE_OTHER): Payer: BC Managed Care – PPO | Admitting: Family Medicine

## 2014-02-16 ENCOUNTER — Encounter: Payer: Self-pay | Admitting: Family Medicine

## 2014-02-16 VITALS — BP 122/78 | HR 60 | Temp 98.4°F | Ht 63.0 in | Wt 159.0 lb

## 2014-02-16 DIAGNOSIS — L723 Sebaceous cyst: Secondary | ICD-10-CM

## 2014-02-16 MED ORDER — ZOSTER VACCINE LIVE 19400 UNT/0.65ML ~~LOC~~ SOLR: 0.6500 mL | Freq: Once | SUBCUTANEOUS | Status: DC

## 2014-02-16 NOTE — Progress Notes (Signed)
   Subjective:    Patient ID: Sheryl Howell, female    DOB: 1955/10/16, 58 y.o.   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 Had it cut and drained 4 years ago. Recurred slowly since that time and became more bothersome to her, had repeat I&D by Dr. Jennette KettleNeal in Feb 2015  Bump has grown again and requests additional removal  No drainage ROS: No fevers, no chills   Review of Systems   See HPI for ROS. All other systems reviewed and are negative.  Past medical history, surgical, family, and social history reviewed and updated in the EMR as appropriate. Objective:  BP 122/78 mmHg  Pulse 60  Temp(Src) 98.4 F (36.9 C) (Oral)  Ht 5\' 3"  (1.6 m)  Wt 159 lb (72.122 kg)  BMI 28.17 kg/m2 Vitals reviewed  General: NAD Head: 1.5cm palpable cyst like structure top right of head, no erythema, moveable with some fluctuance. Tender to palpation.  PROCEDURE NOTE: Patient given informed consent, signed copy in the chart. Discussed risks of bleeding, pain, infection, balding of hair in the area of cyst. Area prepped and draped in usual sterile fashiong, iodine swabs x 3. Local anesthesia performed with 2.5cc of 1% lidocaine with epinephrine. A 6mm incision was made over the top of the cyst using a #11 blade. Small amount of bleeding and leakage of brown serous fluid expressed. Capsule attempted to be removed with forceps, however despite this had difficulty with removal of capsule. Incision closed with dermabond. Aftercare discussed, keep area clean, gentle soap cleansing, watch for signs of infection (redness, fever, drainage) and bleeding.   Assessment & Plan:  See Problem List Documentation

## 2014-02-16 NOTE — Patient Instructions (Signed)
I think we will have to refer you to another specialist to cut out a bigger portion of the cyst. Please call our clinic in about 1 month if you are still having pain/discomfort from the cyst.  Avoid washing your hair today and tomorrow, resume normal hygiene care on Friday. Use any over the counter pain medications you would like for any pain. Ice the area for 15 minutes (do not place directly on the skin) every few hours today. If you get any bleeding, hold gauze and a bandaid with pressure. If bleeding continues after 10 minutes, please call.

## 2014-02-16 NOTE — Assessment & Plan Note (Signed)
I&D in office today. Not confident in removal of entire capsule. I informed her she will most likely need to be referred for more definitive excision, and given location on scalp with hair follicles feel this may be better done at specialist office (derm) to avoid any bald spots. Pt to call in 1 month if pain and cyst recurs and will make referral at that time.

## 2014-02-17 ENCOUNTER — Ambulatory Visit: Payer: BC Managed Care – PPO | Admitting: Psychiatry

## 2014-04-26 ENCOUNTER — Other Ambulatory Visit: Payer: Self-pay | Admitting: Family Medicine

## 2014-04-26 DIAGNOSIS — F330 Major depressive disorder, recurrent, mild: Secondary | ICD-10-CM

## 2014-05-13 ENCOUNTER — Other Ambulatory Visit: Payer: Self-pay

## 2014-05-13 DIAGNOSIS — Z1231 Encounter for screening mammogram for malignant neoplasm of breast: Secondary | ICD-10-CM

## 2014-06-09 LAB — BASIC METABOLIC PANEL
BUN: 10 mg/dL (ref 4–21)
Creatinine: 0.8 mg/dL (ref 0.5–1.1)
Glucose: 94 mg/dL
Potassium: 4.3 mmol/L (ref 3.4–5.3)
SODIUM: 138 mmol/L (ref 137–147)

## 2014-06-09 LAB — LIPID PANEL
Cholesterol: 245 mg/dL — AB (ref 0–200)
HDL: 61 mg/dL (ref 35–70)
LDL Cholesterol: 143 mg/dL
TRIGLYCERIDES: 204 mg/dL — AB (ref 40–160)

## 2014-06-09 LAB — HEPATIC FUNCTION PANEL
ALK PHOS: 107 U/L (ref 25–125)
ALT: 13 U/L (ref 7–35)
AST: 13 U/L (ref 13–35)
Bilirubin, Total: 0.3 mg/dL

## 2014-06-09 LAB — CBC AND DIFFERENTIAL
HCT: 44 % (ref 36–46)
Hemoglobin: 15.4 g/dL (ref 12.0–16.0)
PLATELETS: 272 10*3/uL (ref 150–399)
WBC: 5.6 10*3/mL

## 2014-06-09 LAB — TSH: TSH: 2.23 u[IU]/mL (ref 0.41–5.90)

## 2014-06-27 ENCOUNTER — Ambulatory Visit
Admission: RE | Admit: 2014-06-27 | Discharge: 2014-06-27 | Disposition: A | Discharge status: Home / Self Care | Payer: BC Managed Care – PPO | Source: Ambulatory Visit

## 2014-06-27 DIAGNOSIS — Z1231 Encounter for screening mammogram for malignant neoplasm of breast: Secondary | ICD-10-CM

## 2014-06-28 ENCOUNTER — Ambulatory Visit: Payer: BC Managed Care – PPO | Admitting: Family Medicine

## 2014-06-29 ENCOUNTER — Encounter: Payer: Self-pay | Admitting: Family Medicine

## 2014-06-29 ENCOUNTER — Ambulatory Visit (INDEPENDENT_AMBULATORY_CARE_PROVIDER_SITE_OTHER): Payer: BC Managed Care – PPO | Admitting: Family Medicine

## 2014-06-29 VITALS — BP 109/68 | HR 56 | Temp 98.0°F | Ht 63.0 in | Wt 163.0 lb

## 2014-06-29 DIAGNOSIS — L723 Sebaceous cyst: Secondary | ICD-10-CM

## 2014-06-29 DIAGNOSIS — E785 Hyperlipidemia, unspecified: Secondary | ICD-10-CM | POA: Diagnosis not present

## 2014-06-29 LAB — URIC ACID: Uric Acid: 3.1 mg/dL

## 2014-06-29 NOTE — Patient Instructions (Signed)
Your lab work looks normal, except for the elevated cholesterol. We would not start medication right now, but work on your diet and exercising daily.  You should expect a phone call regarding the dermatology appointment.

## 2014-06-29 NOTE — Assessment & Plan Note (Signed)
Recurred. Pt interested in referral for excision. Refer to dermatology (pt states she is already established, so will contact their office first).

## 2014-06-29 NOTE — Progress Notes (Signed)
   Subjective:    Patient ID: Sheryl Howell, female    DOB: [birth year], 59 y.o.   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 ASCVD 10 yr risk calculator 1.9%  # Sebaceous cyst of scalp  Recurred after I&D in December.   Still draining every now and then  Bothersome for her because of its location, would like it fixed  Review of Systems   See HPI for ROS. All other systems reviewed and are negative.  Past medical history, surgical, family, and social history reviewed and updated in the EMR as appropriate. Objective:  BP 109/68 mmHg  Pulse 56  Temp(Src) 98 F (36.7 C) (Oral)  Ht 5\' 3"  (1.6 m)  Wt 163 lb (73.936 kg)  BMI 28.88 kg/m2 Vitals and nursing note reviewed  General: NAD Skin: ~1.5cm cyst present posterior scalp, dried scab overlying cyst  Assessment & Plan:  See Problem List Documentation  15 minutes spent face to face with patient regarding direct counseling of disease process and answering questions from patient.

## 2014-06-29 NOTE — Assessment & Plan Note (Signed)
Elevated LDL. ASCVD 1619yr risk 1.9%. Discussed diet modification and daily exercise, says she will work on both but still having some social stressors/issues with daughter. With no other risk factors will not initiate lipid lower medicine.

## 2014-07-24 ENCOUNTER — Other Ambulatory Visit: Payer: Self-pay | Admitting: Family Medicine

## 2014-07-25 NOTE — Telephone Encounter (Signed)
Refill request. Will forward to PCP for review. Yanelle Sousa, CMA. 

## 2015-03-03 ENCOUNTER — Ambulatory Visit: Payer: BC Managed Care – PPO

## 2015-03-03 ENCOUNTER — Encounter: Payer: Self-pay | Admitting: Podiatry

## 2015-03-03 ENCOUNTER — Ambulatory Visit (INDEPENDENT_AMBULATORY_CARE_PROVIDER_SITE_OTHER): Payer: BC Managed Care – PPO | Admitting: Podiatry

## 2015-03-03 ENCOUNTER — Ambulatory Visit (INDEPENDENT_AMBULATORY_CARE_PROVIDER_SITE_OTHER): Payer: BC Managed Care – PPO

## 2015-03-03 VITALS — BP 122/56 | HR 66 | Resp 16

## 2015-03-03 DIAGNOSIS — S93602A Unspecified sprain of left foot, initial encounter: Secondary | ICD-10-CM

## 2015-03-03 DIAGNOSIS — M779 Enthesopathy, unspecified: Secondary | ICD-10-CM | POA: Diagnosis not present

## 2015-03-03 MED ORDER — TRIAMCINOLONE ACETONIDE 10 MG/ML IJ SUSP
10.0000 mg | Freq: Once | INTRAMUSCULAR | Status: AC
  Administered 2015-03-03: 10 mg

## 2015-03-03 NOTE — Progress Notes (Signed)
   Subjective:    Patient ID: Sheryl Howell, female    DOB: 01-21-1956, 59 y.o.   MRN: [id number]  HPI  Pt presents with left foot pain on dorsal side that radiates across for 6 weeks, constant pain but worsens with walking  Review of Systems  HENT: Positive for sneezing.   Musculoskeletal: Positive for arthralgias.  All other systems reviewed and are negative.      Objective:   Physical Exam        Assessment & Plan:

## 2015-03-12 NOTE — Progress Notes (Signed)
Subjective:     Patient ID: Sheryl Howell, female   DOB: 1955/09/26, 59 y.o.   MRN: [id number]  HPI patient states I'm having a lot of pain on top of my left foot with inflammation and fluid buildup and at times it makes it hard for me to wear   Review of Systems  All other systems reviewed and are negative.      Objective:   Physical Exam  Constitutional: She is oriented to person, place, and time.  Cardiovascular: Intact distal pulses.   Musculoskeletal: Normal range of motion.  Neurological: She is oriented to person, place, and time.  Skin: Skin is warm.  Nursing note and vitals reviewed.  neurovascular status intact muscle strength adequate range of motion within normal limits with patient noted to have pain on top of the left foot with fluid buildup around the extensor tendon complex but no indications of muscle strength loss     Assessment:      acute dorsal tendinitis left    Plan:      H&P and x-rays reviewed with patient. Injected the dorsal complex 3 mg Kenalog 5 mg Xylocaine advised on physical therapy wider-type shoes and reappoint to recheck

## 2015-04-04 ENCOUNTER — Other Ambulatory Visit: Payer: Self-pay | Admitting: *Deleted

## 2015-04-04 MED ORDER — CITALOPRAM HYDROBROMIDE 40 MG PO TABS: 40.0000 mg | ORAL_TABLET | Freq: Every day | ORAL | Status: DC

## 2015-04-18 ENCOUNTER — Encounter: Payer: Self-pay | Admitting: Family Medicine

## 2015-04-18 ENCOUNTER — Ambulatory Visit (INDEPENDENT_AMBULATORY_CARE_PROVIDER_SITE_OTHER): Payer: BC Managed Care – PPO | Admitting: Family Medicine

## 2015-04-18 VITALS — BP 120/91 | HR 88 | Temp 98.3°F | Ht 63.0 in | Wt 163.8 lb

## 2015-04-18 DIAGNOSIS — M7121 Synovial cyst of popliteal space [Baker], right knee: Secondary | ICD-10-CM | POA: Diagnosis not present

## 2015-04-18 NOTE — Progress Notes (Signed)
    Subjective   Sheryl Howell is a 60 y.o. female that presents for a same day visit  1. Right leg pain: Symptoms started this morning with a feeling of a "band like" sensation but now is more painful in her calf area. Pain is getting worse. She has not tried anything to help with the pain. She reports a history of ACL repair. No recent trauma. 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 No recent surgeries or hospital stays. No chest pain or dyspnea.   ROS Per HPI  Social History  Substance Use Topics  . Smoking status: Never Smoker   . Smokeless tobacco: Never Used  . Alcohol Use: Yes     Comment: rarely    Allergies  Allergen Reactions  . Sulfa Antibiotics Nausea Only    Objective   BP 120/91 mmHg  Pulse 88  Temp(Src) 98.3 F (36.8 C) (Oral)  Ht  (1.6 m)  Wt 163 lb 12.8 oz (74.299 kg)  BMI 29.02 kg/m2  General: Well appearing, no distress Musculoskeletal: Right knee appears normal. Tenderness at medial aspect of popliteal fossa. On ultrasound, visualized a 2.6x2.2cm cystic structure  Assessment and Plan   1. Baker cyst, right - ACE wrap - Elevate - ibuprofen 400-600 q6hrs prn for one week - red flags

## 2015-04-18 NOTE — Patient Instructions (Signed)
Thank you for coming to see me today. It was a pleasure. 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 This is usually a non-harmful diagnosis. Please return if symptoms worsen. If you develop chest pain, fast heart rate, shortness of breath, please go straight to the hospital. If your calf pain gets worse, calf gets swollen or red, please return for immediate follow-up or go to the emergency department.  Please make an appointment to see Dr. Waynetta Sandy for routine follow-up.  If you have any questions or concerns, please do not hesitate to call the office at [phone].  Sincerely,  Jacquelin Hawking, MD   Excell Seltzer Cyst A Baker cyst is a sac-like structure that forms in the back of the knee. It is filled with the same fluid that is located in your knee. This fluid lubricates the bones and cartilage of the knee and allows them to move over each other more easily. CAUSES  When the knee becomes injured or inflamed, increased fluid forms in the knee. When this happens, the joint lining is pushed out behind the knee and forms the Baker cyst. This cyst may also be caused by inflammation from arthritic conditions and infections. SIGNS AND SYMPTOMS  A Baker cyst usually has no symptoms. When the cyst is substantially enlarged:  You may feel pressure behind the knee, stiffness in the knee, or a mass in the area behind the knee.  You may develop pain, redness, and swelling in the calf. This can suggest a blood clot and requires evaluation by your health care provider. DIAGNOSIS  A Baker cyst is most often found during an ultrasound exam. This exam may have been performed for other reasons, and the cyst was found incidentally. Sometimes an MRI is used. This picks up other problems within a joint that an ultrasound exam may not. If the Baker cyst developed immediately after an injury, X-ray exams may be used to diagnose the cyst. TREATMENT  The treatment depends on  the cause of the cyst. Anti-inflammatory medicines and rest often will be prescribed. If the cyst is caused by a bacterial infection, antibiotic medicines may be prescribed.  HOME CARE INSTRUCTIONS   If the cyst was caused by an injury, for the first 24 hours, keep the injured leg elevated on 2 pillows while lying down.  For the first 24 hours while you are awake, apply ice to the injured area:  Put ice in a plastic bag.  Place a towel between your skin and the bag.  Leave the ice on for 20 minutes, 2-3 times a day.  Only take over-the-counter or prescription medicines for pain, discomfort, or fever as directed by your health care provider.  Only take antibiotic medicine as directed. Make sure to finish it even if you start to feel better. MAKE SURE YOU:   Understand these instructions.  Will watch your condition.  Will get help right away if you are not doing well or get worse.   This information is not intended to replace advice given to you by your health care provider. Make sure you discuss any questions you have with your health care provider.   Document Released: 03/04/2005 Document Revised: 12/23/2012 Document Reviewed: 10/14/2012 Elsevier Interactive Patient Education [employer]

## 2015-05-12 ENCOUNTER — Encounter: Payer: Self-pay | Admitting: Family Medicine

## 2015-05-12 ENCOUNTER — Ambulatory Visit (INDEPENDENT_AMBULATORY_CARE_PROVIDER_SITE_OTHER): Payer: BC Managed Care – PPO | Admitting: Family Medicine

## 2015-05-12 VITALS — BP 140/88 | HR 60 | Temp 98.9°F | Ht 63.0 in | Wt 165.0 lb

## 2015-05-12 DIAGNOSIS — H938X3 Other specified disorders of ear, bilateral: Secondary | ICD-10-CM | POA: Diagnosis not present

## 2015-05-12 DIAGNOSIS — R05 Cough: Secondary | ICD-10-CM | POA: Diagnosis not present

## 2015-05-12 DIAGNOSIS — R0981 Nasal congestion: Secondary | ICD-10-CM

## 2015-05-12 DIAGNOSIS — R059 Cough, unspecified: Secondary | ICD-10-CM

## 2015-05-12 MED ORDER — BENZONATATE 200 MG PO CAPS: 200.0000 mg | ORAL_CAPSULE | Freq: Three times a day (TID) | ORAL | Status: DC | PRN

## 2015-05-12 MED ORDER — IPRATROPIUM BROMIDE 0.03 % NA SOLN: 2.0000 | Freq: Three times a day (TID) | NASAL | Status: DC | PRN

## 2015-05-12 MED FILL — BENZONATATE 200 MG CAPSULE: 200 | 10 days supply | Qty: 30 | Fill #0

## 2015-05-12 MED FILL — IPRATROPIUM 0.03% SPRAY: 0.03 | 30 days supply | Qty: 30 | Fill #0

## 2015-05-12 NOTE — Patient Instructions (Signed)
It was great seeing you today. Your symptoms are due to a viral illness. Antibiotics will not help improve your symptoms, but the following will help you feel better while your body fights the virus.   Drink lots of water (Guaifenesin "Mucinex")  Nasal Saline Spray  Congestion: Atrovent nasal spray:  2 sprays/nostril (84 mcg) 3 to 4 times per day  Can try : Afrin (Phenylephrine). DO NOT USE MORE THAN 3 DAYS or Oral: Pseudoephedrine  Sneezing & Runny nose: Antihistamines: Zyrtec, Claritin, Allegra  Pain/Sore throat: Tylenol, Ibuprofen  Cough: Tessalon Perles every 8 hours as needed Wash your hands often to prevent spreading the virus  If you have any questions or concerns before then, please call the clinic at [phone].  Take Care,   Dr Wenda Low

## 2015-05-12 NOTE — Progress Notes (Signed)
   Subjective:    Patient ID: Sheryl Howell, female    DOB: [birth year], 60 y.o.   MRN: [id number]  HPI Comments: She reports nasal congestion, cough, ear fullness and pain over the past 10 days.  She has had some improvement in symptoms but cough and ear fullness remaining.  She denies fever, chest pain, shortness of breath, or rashes.  She doesn't smoke and he received a flu vaccine this year.  She denies any myalgias or headache.      Review of Systems  Constitutional: Positive for appetite change and fatigue. Negative for fever, chills and activity change.  HENT: Positive for congestion, ear pain, postnasal drip and rhinorrhea. Negative for mouth sores, sneezing and sore throat.   Eyes: Negative for photophobia, pain and itching.  Respiratory: Positive for cough. Negative for chest tightness, shortness of breath and wheezing.   Cardiovascular: Negative for chest pain, palpitations and leg swelling.  Gastrointestinal: Negative for abdominal pain and abdominal distention.  Genitourinary: Negative for dysuria.  Musculoskeletal: Negative for myalgias and neck stiffness.  Skin: Negative for rash.  Neurological: Positive for dizziness. Negative for numbness and headaches.      Objective:   Physical Exam  Constitutional: She is oriented to person, place, and time. She appears well-developed and well-nourished. No distress.  HENT:  Bilateral middle ear effusion without bulging or erythema; pharyngeal erythema without tonsillar exudates.  No cervical adenopathy.  Nonpalpable thyroid  Eyes: Conjunctivae and EOM are normal. Pupils are equal, round, and reactive to light.  Neck: Neck supple.  Cardiovascular: Normal rate and regular rhythm.   No murmur heard. Pulmonary/Chest: Effort normal and breath sounds normal. No respiratory distress. She has no wheezes. She has no rales.  Abdominal: Soft. She exhibits no distension. There is no tenderness.  Lymphadenopathy:    She has no  cervical adenopathy.  Neurological: She is alert and oriented to person, place, and time.  Skin: Skin is warm. No rash noted. She is not diaphoretic.  Nursing note and vitals reviewed.     Assessment & Plan:   1. Cough Likely secondary to postnasal drip and current viral URI - ipratropium (ATROVENT) 0.03 % nasal spray; Place 2 sprays into both nostrils 3 (three) times daily as needed for rhinitis.  Dispense: 30 mL; Refill: 0 - benzonatate (TESSALON) 200 MG capsule; Take 1 capsule (200 mg total) by mouth 3 (three) times daily as needed for cough.  Dispense: 30 capsule; Refill: 0  2. Ear fullness, bilateral Bilateral middle ear effusion without bulging, secondary to URI - ipratropium (ATROVENT) 0.03 % nasal spray; Place 2 sprays into both nostrils 3 (three) times daily as needed for rhinitis.  Dispense: 30 mL; Refill: 0 - benzonatate (TESSALON) 200 MG capsule; Take 1 capsule (200 mg total) by mouth 3 (three) times daily as needed for cough.  Dispense: 30 capsule; Refill: 0  3. Nasal congestion - ipratropium (ATROVENT) 0.03 % nasal spray; Place 2 sprays into both nostrils 3 (three) times daily as needed for rhinitis.  Dispense: 30 mL; Refill: 0 - benzonatate (TESSALON) 200 MG capsule; Take 1 capsule (200 mg total) by mouth 3 (three) times daily as needed for cough.  Dispense: 30 capsule; Refill: 0

## 2015-05-24 ENCOUNTER — Other Ambulatory Visit: Payer: Self-pay

## 2015-05-24 DIAGNOSIS — Z1231 Encounter for screening mammogram for malignant neoplasm of breast: Secondary | ICD-10-CM

## 2015-06-29 ENCOUNTER — Ambulatory Visit: Payer: BC Managed Care – PPO

## 2015-07-10 ENCOUNTER — Ambulatory Visit
Admission: RE | Admit: 2015-07-10 | Discharge: 2015-07-10 | Disposition: A | Discharge status: Home / Self Care | Payer: BC Managed Care – PPO | Source: Ambulatory Visit

## 2015-07-10 DIAGNOSIS — Z1231 Encounter for screening mammogram for malignant neoplasm of breast: Secondary | ICD-10-CM

## 2015-07-28 ENCOUNTER — Other Ambulatory Visit (HOSPITAL_COMMUNITY)
Admission: RE | Admit: 2015-07-28 | Discharge: 2015-07-28 | Disposition: A | Discharge status: Home / Self Care | Payer: BC Managed Care – PPO | Source: Ambulatory Visit | Attending: Family Medicine | Admitting: Family Medicine

## 2015-07-28 ENCOUNTER — Ambulatory Visit (INDEPENDENT_AMBULATORY_CARE_PROVIDER_SITE_OTHER): Payer: BC Managed Care – PPO | Admitting: Family Medicine

## 2015-07-28 ENCOUNTER — Encounter: Payer: Self-pay | Admitting: Family Medicine

## 2015-07-28 VITALS — BP 119/73 | HR 59 | Temp 97.5°F | Ht 63.0 in | Wt 160.0 lb

## 2015-07-28 DIAGNOSIS — Z124 Encounter for screening for malignant neoplasm of cervix: Secondary | ICD-10-CM

## 2015-07-28 DIAGNOSIS — Z1151 Encounter for screening for human papillomavirus (HPV): Secondary | ICD-10-CM | POA: Insufficient documentation

## 2015-07-28 DIAGNOSIS — F330 Major depressive disorder, recurrent, mild: Secondary | ICD-10-CM

## 2015-07-28 DIAGNOSIS — N952 Postmenopausal atrophic vaginitis: Secondary | ICD-10-CM

## 2015-07-28 MED ORDER — ESTROGENS, CONJUGATED 0.625 MG/GM VA CREA: 1.0000 | TOPICAL_CREAM | Freq: Every day | VAGINAL | Status: DC

## 2015-07-28 NOTE — Progress Notes (Signed)
   Subjective:    Patient ID: Sheryl Howell, female    DOB: 05-07-1955, 60 y.o.   MRN: [id number]  HPI  CC: pap smear  # Pap smear:  No prior abnormal paps  States she does not experience much symptoms   # Depression  Reports no issues with celexa, doing well  Social Hx: never smoker  Review of Systems   See HPI for ROS.   Past medical history, surgical, family, and social history reviewed and updated in the EMR as appropriate. Objective:  BP 119/73 mmHg  Pulse 59  Temp(Src) 97.5 F (36.4 C) (Oral)  Ht 5\' 3"  (1.6 m)  Wt 160 lb (72.576 kg)  BMI 28.35 kg/m2  SpO2 96% Vitals and nursing note reviewed  General: no apparent distress  GU: exam done with chaperone in room. there is some atrophic appearing vaginal mucosa. Cervix is normal in appearance.  Assessment & Plan:   1. Screening for cervical cancer Pap smear today. Discussed atrophic appearing vaginal tissue, see below. - Cervicovaginal ancillary only  2. DEPRESSION, MILD, RECURRENT stable  3. Vaginal atrophy Discussed trial of premarin cream, see separate discussion via mychart messages. rx sent in for patient to trial if she wishes, follow up as needed.

## 2015-07-31 ENCOUNTER — Encounter: Payer: Self-pay | Admitting: Family Medicine

## 2015-08-01 LAB — CERVICOVAGINAL ANCILLARY ONLY: HPV: NOT DETECTED

## 2015-09-15 ENCOUNTER — Encounter: Payer: Self-pay | Admitting: Family Medicine

## 2015-10-05 ENCOUNTER — Ambulatory Visit: Payer: BC Managed Care – PPO | Admitting: Podiatry

## 2015-10-23 ENCOUNTER — Encounter: Payer: Self-pay | Admitting: Internal Medicine

## 2015-10-23 ENCOUNTER — Ambulatory Visit (INDEPENDENT_AMBULATORY_CARE_PROVIDER_SITE_OTHER): Payer: BC Managed Care – PPO | Admitting: Internal Medicine

## 2015-10-23 DIAGNOSIS — M7021 Olecranon bursitis, right elbow: Secondary | ICD-10-CM | POA: Insufficient documentation

## 2015-10-23 MED ORDER — CLINDAMYCIN HCL 300 MG PO CAPS: 300.0000 mg | ORAL_CAPSULE | Freq: Three times a day (TID) | ORAL | 0 refills | Status: DC

## 2015-10-23 MED FILL — CLINDAMYCIN HCL 300 MG CAPS: 300 | 7 days supply | Qty: 21 | Fill #0

## 2015-10-23 NOTE — Assessment & Plan Note (Addendum)
Olecranon bursitis, hx concerning for septic bursitis. Improved significantly with 7 days of treatment [facility]  - Continue 300 mg TID clindamycin for another 7 days for a total of two weeks treatment   - Follow up in 1 week, if no improvement consider drainage  - Ibuprofen for pain

## 2015-10-23 NOTE — Patient Instructions (Addendum)
Please take clindamycin 300 mg three times daily. If improvement over the next 7-8 days no need for follow up, if no improvement then please return.

## 2015-10-23 NOTE — Progress Notes (Signed)
   [facility] Sheryl CharsAsiyah Mikell, MD Phone: [phone]  Reason For Visit: Elbow Pain   # On the 27th of the July noticed a small cut on elbow. Patient woke up on the 30th, elbow swollen, warm and painful. [facility] and received clindamycin for 7 days 300 mg TID. The next day, patient worsened with redness traveling up her arm from her elbow. Howeve after 48 hours pain and redness began to improve. After stopping the medication has not improved further improvement or worsening. States that it is still painful to the touch and with motion. Patient has been taking ibuprofen for the pain as needed. No fever or chills, no nausea or vomiting. No trauma   Past Medical History - No diabetes  Reviewed problem list.  Medications- reviewed and updated No additions to family history Social history- patient is a non smoker  Objective: BP 114/63   Pulse 69   Temp 98.6 F (37 C) (Oral)   Ht 5\' 3"  (1.6 m)   Wt 167 lb (75.8 kg)   BMI 29.58 kg/m  Gen: NAD, alert, cooperative with exam Ext: Right elbow, swollen, slightly warm to the touch, tender to palpation, normal ROM, however painful with range of motions  Assessment/Plan: See problem based a/p  Olecranon bursitis of right elbow Olecranon bursitis, hx concerning for septic bursitis. Improved significantly with 7 days of treatment with [facility].  - Continue 300 mg TID clindamycin for another 7 days for a total of two weeks treatment   - Follow up in 1 week, if no improvement consider drainage  - Ibuprofen for pain

## 2015-11-03 ENCOUNTER — Ambulatory Visit (INDEPENDENT_AMBULATORY_CARE_PROVIDER_SITE_OTHER): Payer: BC Managed Care – PPO | Admitting: Internal Medicine

## 2015-11-03 ENCOUNTER — Encounter: Payer: Self-pay | Admitting: Internal Medicine

## 2015-11-03 VITALS — BP 102/60 | HR 78 | Temp 98.2°F | Ht 63.0 in | Wt 169.0 lb

## 2015-11-03 DIAGNOSIS — M7021 Olecranon bursitis, right elbow: Secondary | ICD-10-CM | POA: Diagnosis not present

## 2015-11-03 NOTE — Progress Notes (Signed)
[facility] Progress Note  Subjective:  Sheryl SellsMaryAnn Howell is a 59-y/o female who presents for follow-up of olecranon bursitis.   R elbow swelling: - Began 3 weeks ago with extensive redness - Took 2 weeks of clindamycin, completing course a few days ago, with resolution of erythema - Denies pain; has been taking ibuprofen 600 mg BID for swelling - Concerned that area of swelling is piece of bone - Does not perform any repetitive motions or play sports - No history of gout ROS: no fevers/chills, no arthralgias  Social: Never smoker  Objective: Blood pressure 102/60, pulse 78, temperature 98.2 F (36.8 C), temperature source Oral, height 5\' 3"  (1.6 m), weight 169 lb (76.7 kg), SpO2 98 %. Constitutional: Well-appearing female in NAD Musculoskeletal: Protruding, soft area of swelling over R olecranon; full ROM with extension and flexion of R elbow without pain but some sensation of tightness Neurological: AOx3, slightly decreased sensation over R elbow Skin: Skin is warm and dry. No rash noted. No increased warmth over left elbow. No erythema.  Vitals reviewed  Assessment/Plan: Olecranon bursitis of right elbow - Erythema resolved. Will not tap elbow because no signs of infection and pt has full range of motion. - Advised patient to continue taking ibuprofen and start icing to help inflammation resolve more quickly - Expect swelling will resolve over next couple of months  Follow-up if swelling does not improve over next couple of months.  Dani GobbleHillary Fitzgerald, MD [facility], PGY-2

## 2015-11-03 NOTE — Patient Instructions (Signed)
[name],  Thank you for coming in today.   Continue ibuprofen and start icing 10 minutes at a time every few hours as you are able to help reduce swelling.  Best, Dr. Sampson GoonFitzgerald  Elbow Bursitis Elbow bursitis is inflammation of the fluid-filled sac (bursa) between the tip of your elbow bone (olecranon) and your skin. Elbow bursitis may also be called olecranon bursitis. Normally, the olecranon bursa has only a small amount of fluid in it to cushion and protect your elbow bone. Elbow bursitis causes fluid to build up inside the bursa. Over time, this swelling and inflammation can cause pain when you bend or lean on your elbow.  CAUSES Elbow bursitis may be caused by:   Elbow injury (acute trauma).  Leaning on hard surfaces for long periods of time.  Infection from an injury that breaks the skin near your elbow.  A bone growth (spur) that forms at the tip of your elbow.  A medical condition that causes inflammation in your body, such as gout or rheumatoid arthritis.  The cause may also be unknown.  SIGNS AND SYMPTOMS  The first sign of elbow bursitis is usually swelling over the tip of your elbow. This can grow to be the size of a golf ball. This may start suddenly or develop gradually. You may also have:  Pain when bending or leaning on your elbow.  Restricted movement of your elbow.  If your bursitis is caused by an infection, symptoms may also include:  Redness, warmth, and tenderness of the elbow.  Drainage of pus from the swollen area over your elbow, if the skin breaks open. DIAGNOSIS  Your health care provider may be able to diagnose elbow bursitis based on your signs and symptoms, especially if you have recently been injured. Your health care provider will also do a physical exam. This may include:  X-rays to look for a bone spur or a bone fracture.  Draining fluid from the bursa to test it for infection.  Blood tests to rule out gout or rheumatoid  arthritis. TREATMENT  Treatment for elbow bursitis depends on the cause. Treatment may include:  Medicines. These may include:  Over-the-counter medicines to relieve pain and inflammation.  Antibiotic medicines to fight infection.  Injections of anti-inflammatory medicines (steroids).  Wrapping your elbow with a bandage.  Draining fluid from the bursa.  Wearing elbow pads.  If your bursitis does not get better with treatment, surgery may be needed to remove the bursa.  HOME CARE INSTRUCTIONS   Take medicines only as directed by your health care provider.  If you were prescribed an antibiotic medicine, finish all of it even if you start to feel better.  If your bursitis is caused by an injury, rest your elbow and wear your bandage as directed by your health care provider. You may alsoapply ice to the injured area as directed by your health care provider:  Put ice in a plastic bag.  Place a towel between your skin and the bag.  Leave the ice on for 20 minutes, 2-3 times per day.  Avoid any activities that cause elbow pain.  Use elbow pads or elbow wraps to cushion your elbow. SEEK MEDICAL CARE IF:  You have a fever.   Your symptoms do not get better with treatment.  Your pain or swelling gets worse.  Your elbow pain or swelling goes away and then returns.  You have drainage of pus from the swollen area over your elbow.   This  information is not intended to replace advice given to you by your health care provider. Make sure you discuss any questions you have with your health care provider.   Document Released: 04/03/2006 Document Revised: 03/25/2014 Document Reviewed: 11/10/2013 Elsevier Interactive Patient Education [employer]

## 2015-11-05 NOTE — Assessment & Plan Note (Signed)
-   Erythema resolved. Will not tap elbow because no signs of infection and pt has full range of motion. - Advised patient to continue taking ibuprofen and start icing to help inflammation resolve more quickly - Expect swelling will resolve over next couple of months

## 2015-11-24 ENCOUNTER — Ambulatory Visit (HOSPITAL_COMMUNITY)
Admission: EM | Admit: 2015-11-24 | Discharge: 2015-11-24 | Disposition: A | Discharge status: Home / Self Care | Payer: BC Managed Care – PPO | Attending: Family Medicine | Admitting: Family Medicine

## 2015-11-24 ENCOUNTER — Encounter (HOSPITAL_COMMUNITY): Payer: Self-pay | Admitting: Emergency Medicine

## 2015-11-24 DIAGNOSIS — Z20818 Contact with and (suspected) exposure to other bacterial communicable diseases: Secondary | ICD-10-CM

## 2015-11-24 DIAGNOSIS — J029 Acute pharyngitis, unspecified: Secondary | ICD-10-CM

## 2015-11-24 LAB — POCT RAPID STREP A: STREPTOCOCCUS, GROUP A SCREEN (DIRECT): NEGATIVE

## 2015-11-24 MED ORDER — AMOXICILLIN 500 MG PO CAPS: 1000.0000 mg | ORAL_CAPSULE | Freq: Two times a day (BID) | ORAL | 0 refills | Status: DC

## 2015-11-24 MED FILL — AMOXICILLIN 500 MG CAPSULE: 500 | 5 days supply | Qty: 20 | Fill #0

## 2015-11-24 NOTE — ED Provider Notes (Signed)
CSN: [id number]     Arrival date & time 11/24/15  1303 History   First MD Initiated Contact with Patient 11/24/15 1332     Chief Complaint  Patient presents with  . Sore Throat   (Consider location/radiation/quality/duration/timing/severity/associated sxs/prior Treatment) HPI History obtained from patient:   LOCATION:throat SEVERITY:6 DURATION:1 day CONTEXT:sudden onset, exposed to daughter who was just dx with strep.   QUALITY:scratchy MODIFYING FACTORS:OTC meds without relief ASSOCIATED SYMPTOMS:hurts to swallow TIMING:now constant  Past Medical History:  Diagnosis Date  . Adverse effect of anesthetic    bp low-takes awhile to come up post op  . Anemia   . Anemia 04/09/2012   Hx of anemia  . Anxiety   . Cholelithiasis and acute cholecystitis with obstruction 04/09/2012  . Depression   . Hepatitis B    9/13-dx hep b, HB S ab + 04/07/2012  . Hx of anxiety disorder 04/09/2012  . Hx of seizure disorder 04/09/2012  . 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 Hypotension   . Nausea   . PONV (postoperative nausea and vomiting)   . Seizures (HCC)    Past Surgical History:  Procedure Laterality Date  . BUNIONECTOMY     both feet  . CHOLECYSTECTOMY  04/08/2012   Procedure: LAPAROSCOPIC CHOLECYSTECTOMY WITH INTRAOPERATIVE CHOLANGIOGRAM;  Surgeon: Emelia LoronMatthew Wakefield, MD;  Location: [facility];  Service: General;  Laterality: Right;  C-  Arm  . COLONOSCOPY    . DIAGNOSTIC LAPAROSCOPY  1992   exp lap fertility  . ERCP  04/09/2012   Procedure: ENDOSCOPIC RETROGRADE CHOLANGIOPANCREATOGRAPHY (ERCP);  Surgeon: Iva Booparl E Gessner, MD;  [facility];  Service: Endoscopy;  Laterality: N/A;  . FOOT SURGERY    . KNEE SURGERY    . WRIST SURGERY     Family History  Problem Relation Age of Onset  . Heart disease    . Cancer Sister     Uterine  . Cancer Maternal Uncle   . Cancer Paternal Aunt     Breast   Social History  Substance Use Topics  . Smoking status: Never Smoker  .  Smokeless tobacco: Never Used  . Alcohol use Yes     Comment: rarely   OB History    No data available     Review of Systems  Denies: HEADACHE, NAUSEA, ABDOMINAL PAIN, CHEST PAIN, CONGESTION, DYSURIA, SHORTNESS OF BREATH  Allergies  Sulfa antibiotics  Home Medications   Prior to Admission medications   Medication Sig Start Date End Date Taking? Authorizing Provider  citalopram (CELEXA) 40 MG tablet Take 1 tablet (40 mg total) by mouth daily. 04/04/15  Yes Nani RavensAndrew M Wight, MD  fish oil-omega-3 fatty acids 1000 MG capsule Take 1 g by mouth daily.   Yes Historical Provider, MD  Multiple Vitamin (MULTIVITAMIN WITH MINERALS) TABS Take 1 tablet by mouth daily.   Yes Historical Provider, MD  amoxicillin (AMOXIL) 500 MG capsule Take 2 capsules (1,000 mg total) by mouth 2 (two) times daily. 11/24/15   Tharon AquasFrank C Patrick, PA  benzonatate (TESSALON) 200 MG capsule Take 1 capsule (200 mg total) by mouth 3 (three) times daily as needed for cough. 05/12/15   Jamal CollinJames R Joyner, MD  clindamycin (CLEOCIN) 300 MG capsule Take 1 capsule (300 mg total) by mouth 3 (three) times daily. 10/23/15   Asiyah Mayra ReelZahra Mikell, MD  conjugated estrogens (PREMARIN) vaginal cream Place 1 Applicatorful vaginally daily. For 2 weeks, then decrease to 1 applicatorful twice weekly 07/28/15   Nani RavensAndrew M Wight,  MD  diclofenac sodium (VOLTAREN) 1 % GEL Apply 2 g topically 4 (four) times daily as needed. For pain. 07/15/13   Nani Ravens, MD  ipratropium (ATROVENT) 0.03 % nasal spray Place 2 sprays into both nostrils 3 (three) times daily as needed for rhinitis. 05/12/15   Jamal Collin, MD   Meds Ordered and Administered this Visit  Medications - No data to display  BP 115/78 (BP Location: Left Arm)   Pulse 61   Temp 98.3 F (36.8 C) (Oral)   Resp 16   SpO2 100%  No data found.   Physical Exam NURSES NOTES AND VITAL SIGNS REVIEWED. CONSTITUTIONAL: Well developed, well nourished, no acute distress HEENT: normocephalic,  atraumatic EYES: Conjunctiva normal NECK:normal ROM, supple, no adenopathy PULMONARY:No respiratory distress, normal effort ABDOMINAL: Soft, ND, NT BS+, No CVAT MUSCULOSKELETAL: Normal ROM of all extremities,  SKIN: warm and dry without rash PSYCHIATRIC: Mood and affect, behavior are normal  Urgent Care Course   Clinical Course   I have discussed negative strep results. Pt requests that antibx be started. We have discussed possible viral ST and therefore no antibx needed.  rx for amoxil provided.  Procedures (including critical care time)  Labs Review Labs Reviewed  POCT RAPID STREP A    Imaging Review No results found.   Visual Acuity Review  Right Eye Distance:   Left Eye Distance:   Bilateral Distance:    Right Eye Near:   Left Eye Near:    Bilateral Near:         MDM   1. Sore throat   2. Acute pharyngitis, unspecified etiology   3. Strep throat exposure     Patient is reassured that there are no issues that require transfer to higher level of care at this time or additional tests. Patient is advised to continue home symptomatic treatment. Patient is advised that if there are new or worsening symptoms to attend the emergency department, contact primary care provider, or return to UC. Instructions of care provided discharged home in stable condition.    THIS NOTE WAS GENERATED USING A VOICE RECOGNITION SOFTWARE PROGRAM. ALL REASONABLE EFFORTS  WERE MADE TO PROOFREAD THIS DOCUMENT FOR ACCURACY.  I have verbally reviewed the discharge instructions with the patient. A printed AVS was given to the patient.  All questions were answered prior to discharge.      Tharon Aquas, PA 11/24/15 1554

## 2015-11-24 NOTE — ED Triage Notes (Signed)
Reports she woke up this am w/ST associated w/odynophagia ... Pain is 9/10  Denies fevers, chills, cold sx  A&O x4... NAD

## 2015-11-24 NOTE — Discharge Instructions (Signed)
SALT WATER GARGLES  SYMPTOMATIC TREATMENT  FOLLOW UP IF THERE ARE NEW OR WORSENING OF SYMPTOMS.

## 2015-11-27 LAB — CULTURE, GROUP A STREP (THRC)

## 2015-12-14 ENCOUNTER — Ambulatory Visit (INDEPENDENT_AMBULATORY_CARE_PROVIDER_SITE_OTHER): Payer: BC Managed Care – PPO | Admitting: Family Medicine

## 2015-12-14 ENCOUNTER — Encounter: Payer: Self-pay | Admitting: Family Medicine

## 2015-12-14 VITALS — BP 114/72 | HR 60 | Temp 98.1°F | Wt 169.0 lb

## 2015-12-14 DIAGNOSIS — D649 Anemia, unspecified: Secondary | ICD-10-CM | POA: Diagnosis not present

## 2015-12-14 DIAGNOSIS — R5382 Chronic fatigue, unspecified: Secondary | ICD-10-CM | POA: Insufficient documentation

## 2015-12-14 DIAGNOSIS — F329 Major depressive disorder, single episode, unspecified: Secondary | ICD-10-CM

## 2015-12-14 DIAGNOSIS — E663 Overweight: Secondary | ICD-10-CM | POA: Diagnosis not present

## 2015-12-14 DIAGNOSIS — F418 Other specified anxiety disorders: Secondary | ICD-10-CM

## 2015-12-14 DIAGNOSIS — F419 Anxiety disorder, unspecified: Secondary | ICD-10-CM

## 2015-12-14 LAB — LIPID PANEL
Cholesterol: 241 mg/dL — ABNORMAL HIGH (ref 125–200)
HDL: 67 mg/dL (ref >=46)
LDL CALC: 145 mg/dL — AB (ref <130)
Total CHOL/HDL Ratio: 3.6 Ratio (ref <=5.0)
Triglycerides: 143 mg/dL (ref <150)
VLDL: 29 mg/dL (ref <30)

## 2015-12-14 LAB — CBC
HEMATOCRIT: 43.7 % (ref 35.0–45.0)
Hemoglobin: 15 g/dL (ref 11.7–15.5)
MCH: 30.5 pg (ref 27.0–33.0)
MCHC: 34.3 g/dL (ref 32.0–36.0)
MCV: 88.8 fL (ref 80.0–100.0)
MPV: 9.8 fL (ref 7.5–12.5)
PLATELETS: 266 10*3/uL (ref 140–400)
RBC: 4.92 MIL/uL (ref 3.80–5.10)
RDW: 12.9 % (ref 11.0–15.0)
WBC: 6.6 10*3/uL (ref 3.8–10.8)

## 2015-12-14 LAB — BASIC METABOLIC PANEL WITH GFR
BUN: 11 mg/dL (ref 7–25)
CALCIUM: 9.8 mg/dL (ref 8.6–10.4)
CO2: 26 mmol/L (ref 20–31)
CREATININE: 0.72 mg/dL (ref 0.50–0.99)
Chloride: 102 mmol/L (ref 98–110)
GFR, Est Non African American: >89 mL/min (ref >=60)
GLUCOSE: 88 mg/dL (ref 65–99)
Potassium: 4.1 mmol/L (ref 3.5–5.3)
Sodium: 139 mmol/L (ref 135–146)

## 2015-12-14 MED ORDER — BUSPIRONE HCL 7.5 MG PO TABS: 7.5000 mg | ORAL_TABLET | Freq: Three times a day (TID) | ORAL | 2 refills | Status: DC

## 2015-12-14 MED FILL — busPIRone HCL 7.5 MG TABS: 7.5 | 20 days supply | Qty: 60 | Fill #0

## 2015-12-14 NOTE — Assessment & Plan Note (Signed)
Given that she seems to be struggling more with anxiety symptoms at the moment, will try low dose buspar. GAD-7 9 and PHQ-9 14 (6 points due to sleep) today. Will follow up in 3-4 weeks and titrate medications as needed.

## 2015-12-14 NOTE — Progress Notes (Signed)
    Subjective:  Sheryl SellsMaryAnn Howell is a 60 y.o. female who [facility] the [facility] today with a chief complaint of fatigue.   HPI:  Anxiety/Depression Patient with longstanding history of anxiety and depression. Currently on citalopram, which helps, though is still struggling with anxiety. She currently sees a Veterinary surgeoncounselor and is having therapy at least once a month. GAD-7 score of 9 today and PHQ-9 score of 14 today.  Fatigue Patient also with long standing history of chronic fatigue and somnolence. Patient says that she often naps for up to 2 hours during the day. She occasionally goes to her car and sleep during lunch break at work. With naps she sleeps around 7 hours a day. Endorses snoring. Occasionally has difficulty falling asleep at night.   ROS: Per HPI  PMH: Smoking history reviewed.    Objective:  Physical Exam: BP 114/72   Pulse 60   Temp 98.1 F (36.7 C) (Oral)   Wt 169 lb (76.7 kg)   SpO2 98%   BMI 29.94 kg/m   Gen: NAD, resting comfortably CV: RRR with no murmurs appreciated Pulm: NWOB, CTAB with no crackles, wheezes, or rhonchi GI: Normal bowel sounds present. Soft, Nontender, Nondistended. MSK: no edema, cyanosis, or clubbing noted Skin: warm, dry Neuro: grossly normal, moves all extremities Psych: Normal affect and thought content  Assessment/Plan:  Anxiety and depression Given that she seems to be struggling more with anxiety symptoms at the moment, will try low dose buspar. GAD-7 9 and PHQ-9 14 (6 points due to sleep) today. Will follow up in 3-4 weeks and titrate medications as needed.   Chronic fatigue Concern for sleep apnea given history of snoring and daytime somnolence. Will order split night sleep study. Will also check TSH, CBC, B12, and vitamin D levels.    Katina Degreealeb M. Jimmey RalphParker, MD [facility] Resident PGY-3 12/14/2015 5:15 PM

## 2015-12-14 NOTE — Assessment & Plan Note (Signed)
Concern for sleep apnea given history of snoring and daytime somnolence. Will order split night sleep study. Will also check TSH, CBC, B12, and vitamin D levels.

## 2015-12-14 NOTE — Patient Instructions (Addendum)
We will start buspar for your anxiety today. It can take a couple weeks for this medication to take effect. Please come back to see me in 4 weeks or so to follow up on this.  We will check blood work today. I would also like for you to get a sleep study.  Take care,  Dr Jimmey RalphParker

## 2015-12-15 ENCOUNTER — Encounter: Payer: Self-pay | Admitting: Family Medicine

## 2015-12-15 LAB — VITAMIN B12: VITAMIN B 12: 755 pg/mL (ref 200–1100)

## 2015-12-15 LAB — TSH: TSH: 1.13 m[IU]/L

## 2015-12-15 LAB — VITAMIN D 25 HYDROXY (VIT D DEFICIENCY, FRACTURES): Vit D, 25-Hydroxy: 26 ng/mL — ABNORMAL LOW (ref 30–100)

## 2015-12-21 ENCOUNTER — Other Ambulatory Visit: Payer: Self-pay | Admitting: Family Medicine

## 2016-01-11 MED FILL — busPIRone HCL 7.5 MG TABS: 7.5 | 20 days supply | Qty: 60 | Fill #1

## 2016-01-15 ENCOUNTER — Ambulatory Visit (HOSPITAL_BASED_OUTPATIENT_CLINIC_OR_DEPARTMENT_OTHER): Payer: BC Managed Care – PPO | Attending: Family Medicine | Admitting: Internal Medicine

## 2016-01-15 VITALS — Ht 63.0 in | Wt 160.0 lb

## 2016-01-15 DIAGNOSIS — R5382 Chronic fatigue, unspecified: Secondary | ICD-10-CM

## 2016-01-15 DIAGNOSIS — R5383 Other fatigue: Secondary | ICD-10-CM | POA: Diagnosis not present

## 2016-01-15 DIAGNOSIS — Z79899 Other long term (current) drug therapy: Secondary | ICD-10-CM | POA: Insufficient documentation

## 2016-01-15 DIAGNOSIS — Z7982 Long term (current) use of aspirin: Secondary | ICD-10-CM | POA: Insufficient documentation

## 2016-01-15 DIAGNOSIS — R0683 Snoring: Secondary | ICD-10-CM | POA: Insufficient documentation

## 2016-01-21 DIAGNOSIS — R5382 Chronic fatigue, unspecified: Secondary | ICD-10-CM

## 2016-01-21 NOTE — Procedures (Signed)
   Patient Name: Sheryl Howell, Sheryl Howell Study Date: 01/15/2016 Gender: Female D.O.B: 06/06/1955 Age (years): 60 Referring Provider: Doralee AlbinoWilliam Hensel Height (inches): 63 Interpreting Physician: Jetty Duhamellinton Satoria Dunlop MD, ABSM Weight (lbs): 160 RPSGT: Wylie HailDavis, Rico BMI: 28 MRN: [id number] Neck Size: 13.50 CLINICAL INFORMATION Sleep Study Type: NPSG   Indication for sleep study: Fatigue   Epworth Sleepiness Score: 11/ 24   SLEEP STUDY TECHNIQUE As per the AASM Manual for the Scoring of Sleep and Associated Events v2.3 (April 2016) with a hypopnea requiring 4% desaturations. The channels recorded and monitored were frontal, central and occipital EEG, electrooculogram (EOG), submentalis EMG (chin), nasal and oral airflow, thoracic and abdominal wall motion, anterior tibialis EMG, snore microphone, electrocardiogram, and pulse oximetry.  MEDICATIONS Medications self-administered by patient taken the night of the study : BUSPIRONE HCL, CITALOPRAM, ASPIRIN  SLEEP ARCHITECTURE The study was initiated at 10:14:00 PM and ended at 4:46:33 AM. Sleep onset time was 47.9 minutes and the sleep efficiency was 82.2%. The total sleep time was 322.5 minutes. Stage REM latency was 322.0 minutes. The patient spent 6.20% of the night in stage N1 sleep, 86.05% in stage N2 sleep, 0.78% in stage N3 and 6.98% in REM. Alpha intrusion was absent. Supine sleep was 20.16%.  RESPIRATORY PARAMETERS The overall apnea/hypopnea index (AHI) was 3.9 per hour. There were 3 total apneas, including 1 obstructive, 2 central and 0 mixed apneas. There were 18 hypopneas and 17 RERAs. The AHI during Stage REM sleep was 0.0 per hour. AHI while supine was 11.1 per hour. The mean oxygen saturation was 92.61%. The minimum SpO2 during sleep was 81.00%. Loud snoring was noted during this study.  CARDIAC DATA The 2 lead EKG demonstrated sinus rhythm. The mean heart rate was 60.95 beats per minute. Other EKG findings include:  None.  LEG MOVEMENT DATA The total PLMS were 196 with a resulting PLMS index of 36.47. Associated arousal with leg movement index was 0.6 .  IMPRESSIONS - No significant obstructive sleep apnea occurred during this study (AHI = 3.9/h). - No significant central sleep apnea occurred during this study (CAI = 0.4/h). - Mild oxygen desaturation was noted during this study (Min O2 = 81.00%). - The patient snored with Loud snoring volume. - No cardiac abnormalities were noted during this study. - Moderate periodic limb movements of sleep occurred during the study. No significant associated arousals.  DIAGNOSIS - Primary Snoring (786.09 [R06.83 ICD-10])  RECOMMENDATIONS - Avoid alcohol, sedatives and other CNS depressants that may worsen sleep apnea and disrupt normal sleep architecture. - Sleep hygiene should be reviewed to assess factors that may improve sleep quality. - Weight management and regular exercise should be initiated or continued if appropriate.  [Electronically signed] 01/21/2016 02:48 PM  Jetty Duhamellinton Marieanne Marxen MD, ABSM Diplomate, American Board of Sleep Medicine   NPI: [id number]  Waymon BudgeYOUNG,Billyjack Trompeter D Diplomate, American Board of Sleep Medicine  ELECTRONICALLY SIGNED ON:  01/21/2016, 2:47 PM [facility] PH: [phone]   FX: [phone] ACCREDITED BY THE AMERICAN ACADEMY OF SLEEP MEDICINE

## 2016-02-05 ENCOUNTER — Ambulatory Visit (INDEPENDENT_AMBULATORY_CARE_PROVIDER_SITE_OTHER): Payer: BC Managed Care – PPO | Admitting: Student

## 2016-02-05 ENCOUNTER — Encounter: Payer: Self-pay | Admitting: Student

## 2016-02-05 VITALS — BP 109/64 | HR 62 | Temp 98.0°F | Wt 171.0 lb

## 2016-02-05 DIAGNOSIS — R42 Dizziness and giddiness: Secondary | ICD-10-CM | POA: Diagnosis not present

## 2016-02-05 DIAGNOSIS — H8112 Benign paroxysmal vertigo, left ear: Secondary | ICD-10-CM

## 2016-02-05 MED ORDER — AMOXICILLIN 500 MG PO CAPS: 500.0000 mg | ORAL_CAPSULE | Freq: Two times a day (BID) | ORAL | 0 refills | Status: DC

## 2016-02-05 MED FILL — AMOXICILLIN 500 MG CAPSULE: 500 | 10 days supply | Qty: 20 | Fill #0

## 2016-02-05 NOTE — Patient Instructions (Addendum)
Follow with PCP in one week Please continue antibiotics as prescribed for possible ear infection  Please obtain CT of your head for dizziness If you have any worsening symptoms, call the office at [phone]

## 2016-02-05 NOTE — Assessment & Plan Note (Signed)
Dizziness likely secondary to BPPV. However differential also includes inner ear infection, intracranial process, stroke. Most likely BPPV given elicited nystagmus on Dix-Hallpike maneuver. - Referral to vestibular physical therapy - We'll treat for ear infection given evidence of ear canal erythema and pain with exam - Will obtain CT head to better rule out intracranial process - Will follow closely

## 2016-02-05 NOTE — Progress Notes (Signed)
   Subjective:    Patient ID: Sheryl Howell, female    DOB: [birth year], 60 y.o.   MRN: [id number]   CC: dizziness  HPI: 60 year old female presenting for dizziness and room spinning  Dizziness - Started yesterday upon waking - Has dizziness worse with walking - Reports left ear pain last week for 2 days which has since resolved, denies fevers - Denies congestion, sore throat, cough, headache - Denies changes in vision, nausea, vomiting, diarrhea, weakness - has never experienced this type of symptom before  Smoking status reviewed  Review of Systems  Per HPI    Objective:  BP 109/64   Pulse 62   Temp 98 F (36.7 C) (Oral)   Wt 171 lb (77.6 kg)   SpO2 94%   BMI 30.29 kg/m  Vitals and nursing note reviewed  General: NAD HEENT: normal TMs bilaterally, mild erythema of left ear canal with pain with pulling of the right pinna Cardiac: RRR Respiratory: CTAB, normal effort Extremities: no edema or cyanosis. WWP. Skin: warm and dry, no rashes noted Neuro:   Cranial Nerves II - XII - II - Visual field intact  III, IV, VI - Extraocular movements intact. V - Facial sensation intact bilaterally. VII - Facial movement intact bilaterally. VIII - Hearing & vestibular intact bilaterally. X - Palate elevates symmetrically, no dysarthria. XI - Chin turning & shoulder shrug intact bilaterally. XII - Tongue protrusion intact.  Motor Strength - The patient's strength was 5/5 in all extremities and pronator drift was absent.  Cerebellar: No ataxia on finger-nose-finger bilaterally  Reflexes - The patient's reflexes were 1+ in all extremities and she had no pathological reflexes..   Coordination - The patient had normal movements in the hands with no ataxia or dysmetria. Tremor was absent.  Gait and Station - normal gait and station., Normal heel toe gait  Dix Halpike maneuver- nystagmus and nausea elicited   Epley manuerver performed x2 without improvement in  her symptoms   Assessment & Plan:    Dizziness Dizziness likely secondary to BPPV. However differential also includes inner ear infection, intracranial process, stroke. Most likely BPPV given elicited nystagmus on Dix-Hallpike maneuver. - Referral to vestibular physical therapy - We'll treat for ear infection given evidence of ear canal erythema and pain with exam - Will obtain CT head to better rule out intracranial process - Will follow closely    Royden Bulman A. Kennon RoundsHaney MD, MS Family Medicine Resident PGY-3 Pager [phone]

## 2016-02-07 ENCOUNTER — Telehealth: Payer: Self-pay | Admitting: Student

## 2016-02-07 ENCOUNTER — Ambulatory Visit (HOSPITAL_COMMUNITY): Payer: BC Managed Care – PPO

## 2016-02-07 DIAGNOSIS — H8112 Benign paroxysmal vertigo, left ear: Secondary | ICD-10-CM

## 2016-02-07 NOTE — Telephone Encounter (Signed)
PT referral for vestibular rehab made.

## 2016-02-16 MED FILL — busPIRone HCL 7.5 MG TABS: 7.5 | 20 days supply | Qty: 60 | Fill #2

## 2016-02-28 ENCOUNTER — Ambulatory Visit (INDEPENDENT_AMBULATORY_CARE_PROVIDER_SITE_OTHER): Payer: BC Managed Care – PPO | Admitting: Podiatry

## 2016-02-28 ENCOUNTER — Encounter: Payer: Self-pay | Admitting: Podiatry

## 2016-02-28 ENCOUNTER — Ambulatory Visit: Payer: BC Managed Care – PPO

## 2016-02-28 ENCOUNTER — Ambulatory Visit (INDEPENDENT_AMBULATORY_CARE_PROVIDER_SITE_OTHER): Payer: BC Managed Care – PPO

## 2016-02-28 VITALS — BP 108/74 | HR 55 | Resp 16

## 2016-02-28 DIAGNOSIS — M79671 Pain in right foot: Secondary | ICD-10-CM

## 2016-02-28 DIAGNOSIS — M722 Plantar fascial fibromatosis: Secondary | ICD-10-CM

## 2016-02-28 DIAGNOSIS — M79672 Pain in left foot: Secondary | ICD-10-CM

## 2016-02-28 DIAGNOSIS — M7752 Other enthesopathy of left foot: Secondary | ICD-10-CM

## 2016-02-28 DIAGNOSIS — M779 Enthesopathy, unspecified: Secondary | ICD-10-CM

## 2016-02-28 MED ORDER — TRIAMCINOLONE ACETONIDE 10 MG/ML IJ SUSP
10.0000 mg | Freq: Once | INTRAMUSCULAR | Status: AC
  Administered 2016-02-28: 10 mg

## 2016-02-28 MED ORDER — MELOXICAM 15 MG PO TABS: 15.0000 mg | ORAL_TABLET | Freq: Every day | ORAL | 2 refills | Status: DC

## 2016-02-28 NOTE — Patient Instructions (Signed)

## 2016-02-29 NOTE — Progress Notes (Signed)
Subjective:     Patient ID: Sheryl Howell, female   DOB: 1955/12/06, 60 y.o.   MRN: [id number]  HPI patient states she's had intense discomfort in the plantar aspect of the right heel for several months and has redevelop pain in the dorsum of the left foot that did well with medication at previous visit   Review of Systems     Objective:   Physical Exam Neurovascular status intact muscle strength adequate with inflammation pain of the right plantar fashion at the insertional point tendon the calcaneus with left foot showing inflammation across the dorsal surface localized in nature    Assessment:     Plantar fasciitis right with pain and dorsal tendinitis left    Plan:     H&P x-rays reviewed and injected the right plantar fashion 3 mg Kenalog 5 mill grams Xylocaine and the dorsal tendon complex left 3 mg Kenalog 5 mill grams Xylocaine and discussed long-term orthotics. Did dispense fascial brace for the right and instructed on reduced activity and placed on mobic 15 mg daily  X-ray indicated that there is no indications of significant arthritis with small spur and no indication of stress fracture

## 2016-03-20 ENCOUNTER — Encounter: Payer: Self-pay | Admitting: Podiatry

## 2016-03-20 ENCOUNTER — Ambulatory Visit (INDEPENDENT_AMBULATORY_CARE_PROVIDER_SITE_OTHER): Payer: BC Managed Care – PPO | Admitting: Podiatry

## 2016-03-20 DIAGNOSIS — M722 Plantar fascial fibromatosis: Secondary | ICD-10-CM | POA: Diagnosis not present

## 2016-03-21 NOTE — Progress Notes (Signed)
Subjective:     Patient ID: Sheryl Howell, female   DOB: January 03, 1956, 61 y.o.   MRN: [id number]  HPI patient states my right foot is doing much better with minimal discomfort and able to walk distances without pain   Review of Systems     Objective:   Physical Exam Neurovascular status intact muscle strength adequate with significant diminishment of discomfort plantar aspect heel with inflammation under control currently    Assessment:     Plantar fasciitis right improved    Plan:     Advised on physical therapy anti-inflammatories and at this point have recommended not going barefoot. May require orthotics in future

## 2016-03-28 ENCOUNTER — Other Ambulatory Visit: Payer: Self-pay | Admitting: Family Medicine

## 2016-04-01 ENCOUNTER — Other Ambulatory Visit: Payer: Self-pay | Admitting: Family Medicine

## 2016-04-02 MED FILL — busPIRone HCL 7.5 MG TABS: 7.5 | 30 days supply | Qty: 90 | Fill #0

## 2016-06-06 MED FILL — busPIRone HCL 7.5 MG TABS: 7.5 | 30 days supply | Qty: 90 | Fill #1

## 2016-08-21 ENCOUNTER — Other Ambulatory Visit: Payer: Self-pay | Admitting: Family Medicine

## 2016-08-21 DIAGNOSIS — Z1231 Encounter for screening mammogram for malignant neoplasm of breast: Secondary | ICD-10-CM

## 2016-08-22 ENCOUNTER — Ambulatory Visit
Admission: RE | Admit: 2016-08-22 | Discharge: 2016-08-22 | Disposition: A | Discharge status: Home / Self Care | Payer: BC Managed Care – PPO | Source: Ambulatory Visit | Attending: Family Medicine | Admitting: Family Medicine

## 2016-08-22 DIAGNOSIS — Z1231 Encounter for screening mammogram for malignant neoplasm of breast: Secondary | ICD-10-CM

## 2016-08-27 MED FILL — busPIRone HCL 7.5 MG TABS: 7.5 | 30 days supply | Qty: 90 | Fill #2

## 2016-08-28 ENCOUNTER — Encounter: Payer: Self-pay | Admitting: Family Medicine

## 2016-08-28 ENCOUNTER — Other Ambulatory Visit (HOSPITAL_COMMUNITY)
Admission: RE | Admit: 2016-08-28 | Discharge: 2016-08-28 | Disposition: A | Discharge status: Home / Self Care | Payer: BC Managed Care – PPO | Source: Ambulatory Visit | Attending: Family Medicine | Admitting: Family Medicine

## 2016-08-28 ENCOUNTER — Ambulatory Visit (INDEPENDENT_AMBULATORY_CARE_PROVIDER_SITE_OTHER): Payer: BC Managed Care – PPO | Admitting: Family Medicine

## 2016-08-28 VITALS — BP 99/62 | HR 54 | Temp 98.1°F | Wt 141.0 lb

## 2016-08-28 DIAGNOSIS — Z0000 Encounter for general adult medical examination without abnormal findings: Secondary | ICD-10-CM | POA: Diagnosis not present

## 2016-08-28 DIAGNOSIS — Z124 Encounter for screening for malignant neoplasm of cervix: Secondary | ICD-10-CM

## 2016-08-28 DIAGNOSIS — Z23 Encounter for immunization: Secondary | ICD-10-CM

## 2016-08-28 NOTE — Progress Notes (Signed)
Subjective:  Sheryl Howell is a 61 y.o. female who presents to the [facility] today with a chief complaint of wellness exam.  HPI:  Healthcare Maintenance Due for pap and tdap. Trying to stay active and healthy.    ROS: All systems reviewed and are negative  PMH:  The following were reviewed and entered/updated in epic: Past Medical History:  Diagnosis Date  . Adverse effect of anesthetic    bp low-takes awhile to come up post op  . Anemia   . Anemia 04/09/2012   Hx of anemia  . Anxiety   . Cholelithiasis and acute cholecystitis with obstruction 04/09/2012  . Depression   . Hepatitis B    9/13-dx hep b, HB S ab + 04/07/2012  . Hx of anxiety disorder 04/09/2012  . Hx of seizure disorder 04/09/2012  . 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 Hypotension   . Nausea   . PONV (postoperative nausea and vomiting)   . Seizures [facility])    Patient Active Problem List   Diagnosis Date Noted  . Dizziness 02/05/2016  . Chronic fatigue 12/14/2015  . Olecranon bursitis of right elbow 10/23/2015  . Hyperlipidemia 06/29/2014  . Sebaceous cyst 02/16/2014  . 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 Hx of seizure disorder 04/09/2012  . Anemia 04/09/2012  . Symptomatic cholelithiasis 04/07/2012  . Acute hepatitis B 11/29/2011  . Overweight(278.02) 05/27/2011  . INSOMNIA 10/11/2009  . Anxiety and depression 09/14/2009  . 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 BUNIONECTOMY     both feet  . CHOLECYSTECTOMY  04/08/2012   Procedure: LAPAROSCOPIC CHOLECYSTECTOMY WITH INTRAOPERATIVE CHOLANGIOGRAM;  Surgeon: Emelia Loron, MD;  Location: [facility];  Service: General;  Laterality: Right;  C-  Arm  . COLONOSCOPY    . DIAGNOSTIC LAPAROSCOPY  1992   exp lap fertility  . ERCP  04/09/2012   Procedure: ENDOSCOPIC RETROGRADE CHOLANGIOPANCREATOGRAPHY (ERCP);  Surgeon: Iva Boop, MD;  Location: [facility];  Service: Endoscopy;  Laterality:  N/A;  . FOOT SURGERY    . KNEE SURGERY    . WRIST SURGERY      Family History  Problem Relation Age of Onset  . Heart disease Unknown   . Cancer Sister        Uterine  . Cancer Maternal Uncle   . Cancer Paternal Aunt        Breast  . Breast cancer Maternal Aunt     Medications- reviewed and updated Current Outpatient Prescriptions  Medication Sig Dispense Refill  . benzonatate (TESSALON) 200 MG capsule Take 1 capsule (200 mg total) by mouth 3 (three) times daily as needed for cough. 30 capsule 0  . busPIRone (BUSPAR) 7.5 MG tablet TAKE 1 TABLET BY MOUTH 3 TIMES DAILY. 90 tablet 11  . busPIRone (BUSPAR) 7.5 MG tablet TAKE 1 TABLET BY MOUTH 3 TIMES DAILY. 60 tablet 11  . citalopram (CELEXA) 40 MG tablet TAKE 1 TABLET DAILY 90 tablet 2  . conjugated estrogens (PREMARIN) vaginal cream Place 1 Applicatorful vaginally daily. For 2 weeks, then decrease to 1 applicatorful twice weekly 42.5 g 3  . diclofenac sodium (VOLTAREN) 1 % GEL Apply 2 g topically 4 (four) times daily as needed. For pain. 100 g 2  . fish oil-omega-3 fatty acids 1000 MG capsule Take 1 g by mouth daily.    Marland Kitchen ipratropium (ATROVENT) 0.03 % nasal spray Place 2 sprays into both nostrils 3 (three)  times daily as needed for rhinitis. 30 mL 0  . meloxicam (MOBIC) 15 MG tablet Take 1 tablet (15 mg total) by mouth daily. 30 tablet 2  . Multiple Vitamin (MULTIVITAMIN WITH MINERALS) TABS Take 1 tablet by mouth daily.     No current facility-administered medications for this visit.     Allergies-reviewed and updated Allergies  Allergen Reactions  . Sulfa Antibiotics Nausea Only    Social History   Social History  . Marital status: Married    Spouse name: N/A  . Number of children: N/A  . Years of education: N/A   Social History Main Topics  . Smoking status: Never Smoker  . Smokeless tobacco: Never Used  . Alcohol use Yes     Comment: rarely  . Drug use: No  . Sexual activity: Not Asked   Other Topics Concern    . None   Social History Narrative  . None     Objective:  Physical Exam: BP 99/62   Pulse (!) 54   Temp 98.1 F (36.7 C) (Oral)   Wt 141 lb (64 kg)   SpO2 98%   BMI 24.98 kg/m   Gen: NAD, resting comfortably HEENT: TMs clear bilaterally. OP clear. MMM, PERRL Neck: Supple, no masses CV: RRR with no murmurs appreciated Pulm: NWOB, CTAB with no crackles, wheezes, or rhonchi GI: Normal bowel sounds present. Soft, Nontender, Nondistended. GU: Normal external female genitalia. Mild vaginal atrophy noted. Cervix without abnormalities.  MSK: no edema, cyanosis, or clubbing noted Skin: warm, dry Neuro: CN2-12 intact. Strength 5/5 in upper and lower extremities bilaterally.  Psych: Normal affect and thought content  Assessment/Plan:  Annual Exam Normal exam today. Pap smear performed. Tdap given. Follow up in 1 year for next annual exam.   Lavon Bothwell M. Jimmey RalphParker, MD [facility] Resident PGY-3 08/28/2016 12:13 PM

## 2016-08-28 NOTE — Patient Instructions (Signed)
Try loperamide, peppermint oil, and fiber for your Gi upset.  We will check a pap today. This takes a few days to come back.  Come back in 1 year.  Take care,  Dr Jimmey RalphParker

## 2016-08-30 LAB — CYTOLOGY - PAP
Diagnosis: NEGATIVE
HPV: NOT DETECTED

## 2016-09-02 ENCOUNTER — Encounter: Payer: Self-pay | Admitting: Family Medicine

## 2016-10-21 ENCOUNTER — Other Ambulatory Visit: Payer: Self-pay | Admitting: Family Medicine

## 2016-11-06 MED FILL — busPIRone HCL 7.5 MG TABS: 7.5 | 30 days supply | Qty: 90 | Fill #3

## 2017-01-20 MED FILL — busPIRone HCL 7.5 MG TABS: 7.5 | 30 days supply | Qty: 90 | Fill #4

## 2017-02-18 ENCOUNTER — Encounter: Payer: Self-pay | Admitting: Internal Medicine

## 2017-02-18 ENCOUNTER — Ambulatory Visit: Payer: BC Managed Care – PPO | Admitting: Internal Medicine

## 2017-02-18 DIAGNOSIS — R197 Diarrhea, unspecified: Secondary | ICD-10-CM | POA: Diagnosis not present

## 2017-02-18 DIAGNOSIS — R1110 Vomiting, unspecified: Secondary | ICD-10-CM | POA: Diagnosis not present

## 2017-02-18 NOTE — Patient Instructions (Addendum)
It was nice meeting you today Ms. Courtois-Horsley!  It is important to remain hydrated. Continue to take small sips of water about every 10-15 minutes. You can eat whatever you feel like you can tolerate, although I would avoid dairy products for the next few days until you are fully recovered.   If you have any questions or concerns, please feel free to call the clinic.   Be well,  Dr. Natale MilchLancaster

## 2017-02-18 NOTE — Assessment & Plan Note (Signed)
Likely viral gastroenteritis vs food poisoning, as multiple other family members who were staying at same resort as patient developed similar symptoms. Last episode of vomiting two days ago, last episode of diarrhea one day ago. Well-hydrated and non-toxic on exam. Still with some nausea however is able to tolerate PO. Generally improving. Encouraged continued hydration with small sips and advancing diet as tolerated.

## 2017-02-18 NOTE — Progress Notes (Signed)
   Subjective:   Patient: Sheryl Howell       Birthdate: 07/06/1955       MRN: [id number]      HPI  Sheryl Howell is a 61 y.o. female presenting for same day appt for vomiting/diarrhea.   Vomiting/diarrhea Began about four days ago. Symptoms began Saturday evening, and included what patient reports as violent vomiting and diarrhea. Last episode of vomiting Sun 0630. Last episode diarrhea yesterday morning. Symptoms developed while she was at a family wedding in [place]. She says multiple other family members have developed similar symptoms. She is beginning to feel better, though still is nauseated, especially when smelling certain foods, and still feels a bit weak and lightheaded at times. Still has decreased appetite and some difficulty drinking. Is trying to drink small sips of water frequently. Was given an anti-nausea medicine by a family member's husband who is a physician but other than that has not taken any meds. Had a headache two days ago but this resolved. Otherwise no additional symptoms. Denies dysuria, hematuria, fevers, chills.   Smoking status reviewed. Patient is never smoker.   Review of Systems See HPI.     Objective:  Physical Exam  Constitutional: She is oriented to person, place, and time and well-developed, well-nourished, and in no distress.  HENT:  Head: Normocephalic and atraumatic.  Nose: Nose normal.  Mouth/Throat: Oropharynx is clear and moist. No oropharyngeal exudate.  Eyes: Conjunctivae and EOM are normal. Pupils are equal, round, and reactive to light. Right eye exhibits no discharge. Left eye exhibits no discharge.  Abdominal: Soft. Bowel sounds are normal. She exhibits no distension. There is no tenderness. There is no rebound and no guarding.  Neurological: She is alert and oriented to person, place, and time.  Skin: Skin is warm and dry.  Psychiatric: Affect and judgment normal.      Assessment & Plan:  Vomiting and  diarrhea Likely viral gastroenteritis vs food poisoning, as multiple other family members who were staying at same resort as patient developed similar symptoms. Last episode of vomiting two days ago, last episode of diarrhea one day ago. Well-hydrated and non-toxic on exam. Still with some nausea however is able to tolerate PO. Generally improving. Encouraged continued hydration with small sips and advancing diet as tolerated.    Tarri AbernethyAbigail J Mishka Stegemann, MD, MPH PGY-3 [facility] Pager [phone]

## 2017-04-25 ENCOUNTER — Other Ambulatory Visit: Payer: Self-pay | Admitting: Family Medicine

## 2017-04-29 ENCOUNTER — Other Ambulatory Visit: Payer: Self-pay | Admitting: Family Medicine

## 2017-04-29 NOTE — Addendum Note (Signed)
Addended by: Henri MedalHARTSELL, Luian Schumpert M on: 04/29/2017 03:13 PM   Modules accepted: Orders

## 2017-04-29 NOTE — Telephone Encounter (Signed)
Pt is upset because her prescription for buspar has been denied.  Please refill her Rx for buspar.   She says this happens continuously.  She will be coming into the office and asking to speak to the supervisor

## 2017-05-15 ENCOUNTER — Encounter: Payer: Self-pay | Admitting: Family Medicine

## 2017-05-15 ENCOUNTER — Ambulatory Visit: Payer: BC Managed Care – PPO | Admitting: Family Medicine

## 2017-05-15 ENCOUNTER — Other Ambulatory Visit: Payer: Self-pay

## 2017-05-15 VITALS — BP 120/76 | HR 80 | Temp 98.0°F | Ht 63.0 in | Wt 149.0 lb

## 2017-05-15 DIAGNOSIS — F329 Major depressive disorder, single episode, unspecified: Secondary | ICD-10-CM | POA: Diagnosis not present

## 2017-05-15 DIAGNOSIS — Z131 Encounter for screening for diabetes mellitus: Secondary | ICD-10-CM

## 2017-05-15 DIAGNOSIS — E785 Hyperlipidemia, unspecified: Secondary | ICD-10-CM | POA: Diagnosis not present

## 2017-05-15 DIAGNOSIS — D519 Vitamin B12 deficiency anemia, unspecified: Secondary | ICD-10-CM | POA: Diagnosis not present

## 2017-05-15 DIAGNOSIS — B169 Acute hepatitis B without delta-agent and without hepatic coma: Secondary | ICD-10-CM | POA: Diagnosis not present

## 2017-05-15 DIAGNOSIS — Z1329 Encounter for screening for other suspected endocrine disorder: Secondary | ICD-10-CM

## 2017-05-15 DIAGNOSIS — F419 Anxiety disorder, unspecified: Secondary | ICD-10-CM

## 2017-05-15 DIAGNOSIS — Z1211 Encounter for screening for malignant neoplasm of colon: Secondary | ICD-10-CM

## 2017-05-15 MED ORDER — BUSPIRONE HCL 7.5 MG PO TABS: ORAL_TABLET | ORAL | 2 refills | Status: DC

## 2017-05-15 NOTE — Patient Instructions (Signed)
It was great seeing you today! We have addressed the following issues today  1. We will follow up on lab work at next visit or sooner if needed.  2. Medications have been refilled.  If we did any lab work today, and the results require attention, either me or my nurse will get in touch with you. If everything is normal, you will get a letter in mail and a message via . If you don't hear from us in two weeks, please give us a call. Otherwise, we look forward to seeing you again at your next visit. If you have any questions or concerns before then, please call the clinic at [phone].  Please bring all your medications to every doctors visit  Sign up for My Chart to have easy access to your labs results, and communication with your Primary care physician. Please ask Front Desk for some assistance.   Please check-out at the front desk before leaving the clinic.    Take Care,   Dr. Sydnee Cabaliallo

## 2017-05-15 NOTE — Progress Notes (Signed)
   Subjective:    Patient ID: Sheryl Howell, female    DOB: 10-05-1955, 62 y.o.   MRN: [id number]   CC: Meeting new PCP and meds refill   HPI: Patient is 62 yo female with a pas t medical history significant for anemia, vitamin D deficiency and anxiety who presents today to meet new PCP. Patient has no acute complaints today and would like to refill medications that were  accidentally sent to previous PCP Dr. Jimmey RalphParker. Patient would like to have blood work done today. Patient currently denies chest pain, SOB, abdominal pain,  fever, chills, nausea and vomiting.  Smoking status reviewed   ROS: all other systems were reviewed and are negative other than in the HPI   Past Medical History:  Diagnosis Date  . Adverse effect of anesthetic    bp low-takes awhile to come up post op  . Anemia   . Anemia 04/09/2012   Hx of anemia  . Anxiety   . Cholelithiasis and acute cholecystitis with obstruction 04/09/2012  . Depression   . Hepatitis B    9/13-dx hep b, HB S ab + 04/07/2012  . Hx of anxiety disorder 04/09/2012  . Hx of seizure disorder 04/09/2012  . 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 Hypotension   . Nausea   . PONV (postoperative nausea and vomiting)   . Seizures (HCC)     Past Surgical History:  Procedure Laterality Date  . BUNIONECTOMY     both feet  . CHOLECYSTECTOMY  04/08/2012   Procedure: LAPAROSCOPIC CHOLECYSTECTOMY WITH INTRAOPERATIVE CHOLANGIOGRAM;  Surgeon: Emelia LoronMatthew Wakefield, MD;  Location: [facility];  Service: General;  Laterality: Right;  C-  Arm  . COLONOSCOPY    . DIAGNOSTIC LAPAROSCOPY  1992   exp lap fertility  . ERCP  04/09/2012   Procedure: ENDOSCOPIC RETROGRADE CHOLANGIOPANCREATOGRAPHY (ERCP);  Surgeon: Iva Booparl E Gessner, MD;  [facility];  Service: Endoscopy;  Laterality: N/A;  . FOOT SURGERY    . KNEE SURGERY    . WRIST SURGERY      Past medical history, surgical, family, and social history reviewed and updated in the EMR as  appropriate.  Objective:  BP 120/76   Pulse 80   Temp 98 F (36.7 C) (Oral)   Ht 5\' 3"  (1.6 m)   Wt 149 lb (67.6 kg)   SpO2 97%   BMI 26.39 kg/m   Vitals and nursing note reviewed  General: NAD, pleasant, able to participate in exam Cardiac: RRR, normal heart sounds, no murmurs. 2+ radial and PT pulses bilaterally Respiratory: CTAB, normal effort, No wheezes, rales or rhonchi Abdomen: soft, nontender, nondistended, no hepatic or splenomegaly, +BS Extremities: no edema or cyanosis. WWP. Skin: warm and dry, no rashes noted Neuro: alert and oriented x4, no focal deficits Psych: Normal affect and mood   Assessment & Plan:   #Health maintenance Patient here with no acute complaints. Physical exam is unremarkable. Will order CBC, CMP, TSH, B12, vitaminD, lipid panel and A1c. Patient is up to date on almost all health maintenance. Referral to ambulatory GI for colonoscopy made.  Prescription for shingle vaccine Will follow up for results   Lovena NeighboursAbdoulaye Lynora Dymond, MD [facility] PGY-2

## 2017-05-16 LAB — CBC WITH DIFFERENTIAL/PLATELET
BASOS ABS: 0 10*3/uL (ref 0.0–0.2)
BASOS: 1 %
EOS (ABSOLUTE): 0.1 10*3/uL (ref 0.0–0.4)
EOS: 2 %
HEMATOCRIT: 43.8 % (ref 34.0–46.6)
HEMOGLOBIN: 15 g/dL (ref 11.1–15.9)
IMMATURE GRANS (ABS): 0 10*3/uL (ref 0.0–0.1)
Immature Granulocytes: 0 %
LYMPHS: 36 %
Lymphocytes Absolute: 1.8 10*3/uL (ref 0.7–3.1)
MCH: 31.6 pg (ref 26.6–33.0)
MCHC: 34.2 g/dL (ref 31.5–35.7)
MCV: 92 fL (ref 79–97)
MONOCYTES: 11 %
Monocytes Absolute: 0.6 10*3/uL (ref 0.1–0.9)
NEUTROS ABS: 2.6 10*3/uL (ref 1.4–7.0)
Neutrophils: 50 %
Platelets: 257 10*3/uL (ref 150–379)
RBC: 4.74 x10E6/uL (ref 3.77–5.28)
RDW: 13.1 % (ref 12.3–15.4)
WBC: 5.1 10*3/uL (ref 3.4–10.8)

## 2017-05-16 LAB — LIPID PANEL
CHOLESTEROL TOTAL: 246 mg/dL — AB (ref 100–199)
Chol/HDL Ratio: 3.9 ratio (ref 0.0–4.4)
HDL: 63 mg/dL (ref >39)
LDL CALC: 147 mg/dL — AB (ref 0–99)
TRIGLYCERIDES: 179 mg/dL — AB (ref 0–149)
VLDL CHOLESTEROL CAL: 36 mg/dL (ref 5–40)

## 2017-05-16 LAB — HEMOGLOBIN A1C
Est. average glucose Bld gHb Est-mCnc: 103 mg/dL
Hgb A1c MFr Bld: 5.2 % (ref 4.8–5.6)

## 2017-05-16 LAB — CMP14+EGFR
ALBUMIN: 4.6 g/dL (ref 3.6–4.8)
ALK PHOS: 92 IU/L (ref 39–117)
ALT: 17 IU/L (ref 0–32)
AST: 15 IU/L (ref 0–40)
Albumin/Globulin Ratio: 2.4 — ABNORMAL HIGH (ref 1.2–2.2)
BUN / CREAT RATIO: 16 (ref 12–28)
BUN: 12 mg/dL (ref 8–27)
Bilirubin Total: 0.5 mg/dL (ref 0.0–1.2)
CO2: 25 mmol/L (ref 20–29)
CREATININE: 0.76 mg/dL (ref 0.57–1.00)
Calcium: 9.8 mg/dL (ref 8.7–10.3)
Chloride: 101 mmol/L (ref 96–106)
GFR calc Af Amer: 98 mL/min/{1.73_m2} (ref >59)
GFR calc non Af Amer: 85 mL/min/{1.73_m2} (ref >59)
GLUCOSE: 92 mg/dL (ref 65–99)
Globulin, Total: 1.9 g/dL (ref 1.5–4.5)
Potassium: 4.1 mmol/L (ref 3.5–5.2)
Sodium: 139 mmol/L (ref 134–144)
Total Protein: 6.5 g/dL (ref 6.0–8.5)

## 2017-05-16 LAB — VITAMIN D 25 HYDROXY (VIT D DEFICIENCY, FRACTURES): VIT D 25 HYDROXY: 24.5 ng/mL — AB (ref 30.0–100.0)

## 2017-05-16 LAB — VITAMIN B12: Vitamin B-12: 1085 pg/mL (ref 232–1245)

## 2017-05-16 LAB — TSH: TSH: 2.28 u[IU]/mL (ref 0.450–4.500)

## 2017-05-21 ENCOUNTER — Other Ambulatory Visit: Payer: Self-pay | Admitting: Family Medicine

## 2017-05-26 ENCOUNTER — Other Ambulatory Visit: Payer: Self-pay | Admitting: *Deleted

## 2017-05-26 DIAGNOSIS — F329 Major depressive disorder, single episode, unspecified: Secondary | ICD-10-CM

## 2017-05-26 DIAGNOSIS — F419 Anxiety disorder, unspecified: Principal | ICD-10-CM

## 2017-05-26 MED ORDER — BUSPIRONE HCL 7.5 MG PO TABS: ORAL_TABLET | ORAL | 2 refills | Status: DC

## 2017-07-09 ENCOUNTER — Other Ambulatory Visit: Payer: Self-pay | Admitting: Internal Medicine

## 2017-07-09 DIAGNOSIS — Z1231 Encounter for screening mammogram for malignant neoplasm of breast: Secondary | ICD-10-CM

## 2017-07-24 ENCOUNTER — Other Ambulatory Visit: Payer: Self-pay | Admitting: Family Medicine

## 2017-08-01 ENCOUNTER — Encounter: Payer: Self-pay | Admitting: Student in an Organized Health Care Education/Training Program

## 2017-08-01 ENCOUNTER — Ambulatory Visit: Payer: BC Managed Care – PPO | Admitting: Student in an Organized Health Care Education/Training Program

## 2017-08-01 ENCOUNTER — Other Ambulatory Visit: Payer: Self-pay

## 2017-08-01 VITALS — Ht 63.0 in | Wt 155.6 lb

## 2017-08-01 DIAGNOSIS — R1032 Left lower quadrant pain: Secondary | ICD-10-CM

## 2017-08-01 DIAGNOSIS — R350 Frequency of micturition: Secondary | ICD-10-CM

## 2017-08-01 LAB — POCT UA - MICROSCOPIC ONLY

## 2017-08-01 LAB — POCT URINALYSIS DIP (MANUAL ENTRY)
BILIRUBIN UA: NEGATIVE mg/dL
Bilirubin, UA: NEGATIVE
Glucose, UA: NEGATIVE mg/dL
LEUKOCYTES UA: NEGATIVE
NITRITE UA: NEGATIVE
PH UA: 6 (ref 5.0–8.0)
PROTEIN UA: NEGATIVE mg/dL
Spec Grav, UA: 1.01 (ref 1.010–1.025)
Urobilinogen, UA: 0.2 E.U./dL

## 2017-08-01 MED ORDER — IBUPROFEN 600 MG PO TABS: 600.0000 mg | ORAL_TABLET | Freq: Four times a day (QID) | ORAL | 0 refills | Status: DC | PRN

## 2017-08-01 NOTE — Patient Instructions (Signed)
It was a pleasure seeing you today in our clinic.  Here is the treatment plan we have discussed and agreed upon together:  Please schedule follow up to be seen in 2 weeks or sooner if symptoms worsen.  Our clinic's number is [phone]. Please call with questions or concerns about what we discussed today.  Be well, Dr. Mosetta Putt

## 2017-08-01 NOTE — Progress Notes (Signed)
Subjective:    Sheryl Howell - 62 y.o. female MRN [id number]  Date of birth: [birth year]  HPI  Sheryl Howell is here for groin pain.  LLQ/groin pain Constant pain in the groin does not radiate. Pain came on 3 days ago and has not changed or moved since onset. It is consistent with previous ovulation pain that the patient had when she was menstruating, though she is now postmenopausal. She has had one episode of left lower back pain which was sharp and lasted 20 minutes, resolved on its own. No fevers. She does endorse increased urinary frequency but no burning with urination or hematuria. No N/V/D/C. No vaginal pruritis or discharge.  Went through menopause around age 74  Review of Symptoms - see HPI PMH - Smoking status noted.    ROS see HPI Smoking Status noted  Health Maintenance:  Health Maintenance Due  Topic Date Due  . COLONOSCOPY  05/26/2017    -  reports that she has never smoked. She has never used smokeless tobacco. - Review of Systems: Per HPI. - Past Medical History: Patient Active Problem List   Diagnosis Date Noted  . Vomiting and diarrhea 02/18/2017  . Dizziness 02/05/2016  . Chronic fatigue 12/14/2015  . Olecranon bursitis of right elbow 10/23/2015  . Hyperlipidemia 06/29/2014  . Sebaceous cyst 02/16/2014  . 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 Hx of seizure disorder 04/09/2012  . Anemia 04/09/2012  . Symptomatic cholelithiasis 04/07/2012  . Acute hepatitis B 11/29/2011  . Overweight(278.02) 05/27/2011  . INSOMNIA 10/11/2009  . Inguinal pain, left 10/11/2009  . Anxiety and depression 09/14/2009  . 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 aspirin EC 81 MG tablet Take 81 mg by mouth daily.    . busPIRone (BUSPAR) 7.5 MG tablet 1-2 tabs daily 90 tablet 2  . cholecalciferol (VITAMIN D) 400 units TABS tablet Take 400 Units by mouth daily.     . citalopram (CELEXA) 40 MG tablet TAKE 1 TABLET DAILY 90 tablet 2  . citalopram (CELEXA) 40 MG tablet TAKE 1 TABLET DAILY 90 tablet 2  . ibuprofen (ADVIL,MOTRIN) 600 MG tablet Take 1 tablet (600 mg total) by mouth every 6 (six) hours as needed. 30 tablet 0  . Multiple Vitamin (MULTIVITAMIN WITH MINERALS) TABS Take 1 tablet by mouth daily.     No current facility-administered medications for this visit.     Review of Systems See HPI     Objective:   Physical Exam Ht  (1.6 m)   Wt 155 lb 9.6 oz (70.6 kg)   BMI 27.56 kg/m  Gen: NAD, alert, cooperative with exam, well-appearing  CV: RRR, good S1/S2, no murmur Resp: CTABL, no wheezes, non-labored Abd: SNTND, BS present, +mild tenderness to deep palpation in RLQ Skin: no rashes, normal turgor  Neuro: no gross deficits.  Psych: good insight, alert and oriented     Assessment & Plan:   Inguinal pain, left Abdominal wall pain seems to be most likely diagnosis. It is possible that there is a kidney stone, however pain does not radiate and patient appears comfortable. She does have trace lysed blood noted on UA. No evidence of UTI and no fevers to think of pyelo. Less likely would be ovarian etiology of pain, but if this were the case she would need workup for malignancy as we would not expect ovarian pain in post-menopausal patient. - patient asked to  follow up in 2 weeks for repeat UA to see clearance of hematuria - return precautions advised - Ibuprofen PRN pain     Orders Placed This Encounter  Procedures  . POCT urinalysis dipstick  . POCT urinalysis dipstick  . POCT UA - Microscopic Only    Meds ordered this encounter  Medications  . ibuprofen (ADVIL,MOTRIN) 600 MG tablet    Sig: Take 1 tablet (600 mg total) by mouth every 6 (six) hours as needed.    Dispense:  30 tablet    Refill:  0    Howard Pouch, MD,MS,  PGY2 08/07/2017 8:53 AM

## 2017-08-07 ENCOUNTER — Encounter: Payer: Self-pay | Admitting: Student in an Organized Health Care Education/Training Program

## 2017-08-07 NOTE — Assessment & Plan Note (Signed)
Abdominal wall pain seems to be most likely diagnosis. It is possible that there is a kidney stone, however pain does not radiate and patient appears comfortable. She does have trace lysed blood noted on UA. No evidence of UTI and no fevers to think of pyelo. Less likely would be ovarian etiology of pain, but if this were the case she would need workup for malignancy as we would not expect ovarian pain in post-menopausal patient. - patient asked to follow up in 2 weeks for repeat UA to see clearance of hematuria - return precautions advised - Ibuprofen PRN pain

## 2017-08-13 NOTE — Progress Notes (Deleted)
   [facility] Phone: [phone]   Date of Visit: 08/14/2017   HPI:  ***  ROS: See HPI.  PMFSH:    PHYSICAL EXAM: There were no vitals taken for this visit. Gen: *** HEENT: *** Heart: *** Lungs: *** Neuro: *** Ext: ***  ASSESSMENT/PLAN:  Health maintenance:  -***  No problem-specific Assessment & Plan notes found for this encounter.  FOLLOW UP: Follow up in *** for ***  Palma Holter, MD PGY 2 [facility]

## 2017-08-14 ENCOUNTER — Ambulatory Visit: Payer: BC Managed Care – PPO | Admitting: Internal Medicine

## 2017-09-03 ENCOUNTER — Ambulatory Visit
Admission: RE | Admit: 2017-09-03 | Discharge: 2017-09-03 | Disposition: A | Discharge status: Home / Self Care | Payer: BC Managed Care – PPO | Source: Ambulatory Visit

## 2017-09-03 ENCOUNTER — Other Ambulatory Visit: Payer: Self-pay | Admitting: Family Medicine

## 2017-09-03 DIAGNOSIS — Z1231 Encounter for screening mammogram for malignant neoplasm of breast: Secondary | ICD-10-CM

## 2018-01-01 ENCOUNTER — Ambulatory Visit: Payer: BC Managed Care – PPO | Admitting: Family Medicine

## 2018-01-01 ENCOUNTER — Other Ambulatory Visit: Payer: Self-pay

## 2018-01-01 ENCOUNTER — Encounter: Payer: Self-pay | Admitting: Family Medicine

## 2018-01-01 VITALS — BP 102/61 | HR 66 | Temp 97.9°F | Wt 156.0 lb

## 2018-01-01 DIAGNOSIS — H9193 Unspecified hearing loss, bilateral: Secondary | ICD-10-CM | POA: Insufficient documentation

## 2018-01-01 DIAGNOSIS — F329 Major depressive disorder, single episode, unspecified: Secondary | ICD-10-CM | POA: Diagnosis not present

## 2018-01-01 DIAGNOSIS — F419 Anxiety disorder, unspecified: Secondary | ICD-10-CM

## 2018-01-01 DIAGNOSIS — Z23 Encounter for immunization: Secondary | ICD-10-CM

## 2018-01-01 MED ORDER — CITALOPRAM HYDROBROMIDE 40 MG PO TABS: 40.0000 mg | ORAL_TABLET | Freq: Every day | ORAL | 2 refills | Status: DC

## 2018-01-01 NOTE — Patient Instructions (Signed)
It was great seeing you today! We have addressed the following issues today  1. I made a referral to audiology you should hear from them soon. 2. Your medication was refilled as requested with multiple refill.  If we did any lab work today, and the results require attention, either me or my nurse will get in touch with you. If everything is normal, you will get a letter in mail and a message via . If you don't hear from [place] in two weeks, please give [place] a call. Otherwise, we look forward to seeing you again at your next visit. If you have any questions or concerns before then, please call the clinic at [phone].  Please bring all your medications to every doctors visit  Sign up for My Chart to have easy access to your labs results, and communication with your Primary care physician. Please ask Front Desk for some assistance.   Please check-out at the front desk before leaving the clinic.    Take Care,   Dr. Sydnee Cabal

## 2018-01-01 NOTE — Assessment & Plan Note (Signed)
Patient presented with bilateral decreased hearing for the past few months.  No red flags associated symptoms.  Complaint seems to be age-related hearing loss.  Patient was able to pass hearing test in clinic today without any issues.  Will refer to audiology for formal assessment.  Patient will follow-up as needed.

## 2018-01-01 NOTE — Progress Notes (Signed)
   Subjective:    Patient ID: Sheryl Howell, female    DOB: October 10, 1955, 62 y.o.   MRN: [id number]   CC: Decreased hearing and medication refill  HPI: Patient is a 62 year old female who presents today complaining of decreased hearing for the past few month.  Patient reports that she has noticed that it is hard for her to hear people in rooms where TV is on.  She also has noticed decrease in hearing.  Patient reports that symptoms are intermittent.  She denies any ringing in her ears.  Patient denies any dizziness, headache.  Smoking status reviewed   ROS: all other systems were reviewed and are negative other than in the HPI   Past Medical History:  Diagnosis Date  . Adverse effect of anesthetic    bp low-takes awhile to come up post op  . Anemia   . Anemia 04/09/2012   Hx of anemia  . Anxiety   . Cholelithiasis and acute cholecystitis with obstruction 04/09/2012  . Depression   . Hepatitis B    9/13-dx hep b, HB S ab + 04/07/2012  . Hx of anxiety disorder 04/09/2012  . Hx of seizure disorder 04/09/2012  . 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 Hypotension   . Nausea   . PONV (postoperative nausea and vomiting)   . Seizures (HCC)     Past Surgical History:  Procedure Laterality Date  . BUNIONECTOMY     both feet  . CHOLECYSTECTOMY  04/08/2012   Procedure: LAPAROSCOPIC CHOLECYSTECTOMY WITH INTRAOPERATIVE CHOLANGIOGRAM;  Surgeon: Emelia Loron, MD;  Location: [facility];  Service: General;  Laterality: Right;  C-  Arm  . COLONOSCOPY    . DIAGNOSTIC LAPAROSCOPY  1992   exp lap fertility  . ERCP  04/09/2012   Procedure: ENDOSCOPIC RETROGRADE CHOLANGIOPANCREATOGRAPHY (ERCP);  Surgeon: Iva Boop, MD;  [facility];  Service: Endoscopy;  Laterality: N/A;  . FOOT SURGERY    . KNEE SURGERY    . WRIST SURGERY      Past medical history, surgical, family, and social history reviewed and updated in the EMR as appropriate.  Objective:  BP 102/61   Pulse  66   Temp 97.9 F (36.6 C) (Oral)   Wt 156 lb (70.8 kg)   SpO2 97%   BMI 27.63 kg/m   Vitals and nursing note reviewed  General: NAD, pleasant, able to participate in exam HEENT: Ears exam within normal limits bilaterally. Cardiac: RRR, normal heart sounds, no murmurs. 2+ radial and PT pulses bilaterally Respiratory: CTAB, normal effort, No wheezes, rales or rhonchi Abdomen: soft, nontender, nondistended, no hepatic or splenomegaly, +BS Extremities: no edema or cyanosis. WWP. Skin: warm and dry, no rashes noted Neuro: alert and oriented x4, no focal deficits Psych: Normal affect and mood   Assessment & Plan:   Decreased hearing of both ears Patient presented with bilateral decreased hearing for the past few months.  No red flags associated symptoms.  Complaint seems to be age-related hearing loss.  Patient was able to pass hearing test in clinic today without any issues.  Will refer to audiology for formal assessment.  Patient will follow-up as needed.  Medications refilled has requested by patient.  Lovena Neighbours, MD [facility] PGY-3

## 2018-02-09 ENCOUNTER — Other Ambulatory Visit: Payer: Self-pay | Admitting: Family Medicine

## 2018-02-09 DIAGNOSIS — H9193 Unspecified hearing loss, bilateral: Secondary | ICD-10-CM

## 2018-02-26 ENCOUNTER — Ambulatory Visit (HOSPITAL_COMMUNITY)
Admission: EM | Admit: 2018-02-26 | Discharge: 2018-02-26 | Disposition: A | Discharge status: Home / Self Care | Payer: BC Managed Care – PPO | Attending: Family Medicine | Admitting: Family Medicine

## 2018-02-26 ENCOUNTER — Encounter (HOSPITAL_COMMUNITY): Payer: Self-pay | Admitting: Family Medicine

## 2018-02-26 DIAGNOSIS — R05 Cough: Secondary | ICD-10-CM | POA: Diagnosis not present

## 2018-02-26 DIAGNOSIS — R0981 Nasal congestion: Secondary | ICD-10-CM | POA: Insufficient documentation

## 2018-02-26 DIAGNOSIS — R053 Chronic cough: Secondary | ICD-10-CM

## 2018-02-26 MED ORDER — AZITHROMYCIN 250 MG PO TABS: 250.0000 mg | ORAL_TABLET | Freq: Every day | ORAL | 0 refills | Status: DC

## 2018-02-26 MED ORDER — HYDROCODONE-HOMATROPINE 5-1.5 MG/5ML PO SYRP: 5.0000 mL | ORAL_SOLUTION | Freq: Four times a day (QID) | ORAL | 0 refills | Status: DC | PRN

## 2018-02-26 NOTE — Discharge Instructions (Addendum)
Be aware, your cough medication may cause drowsiness. Please do not drive, operate heavy machinery or make important decisions while on this medication, it may cloud your judgement. ? ?

## 2018-02-26 NOTE — ED Triage Notes (Signed)
X 2 weeks pt cc congestion and  Coughing up thick light green mucus. Chills.x   4 days

## 2018-02-26 NOTE — ED Provider Notes (Signed)
[facility]   [id number] 02/26/18 Arrival Time: 1558  ASSESSMENT & PLAN:  1. Persistent cough   2. Nasal congestion    With lack of fever and relatively benign exam, no indication for chest imaging at this time.  Meds ordered this encounter  Medications  . HYDROcodone-homatropine (HYCODAN) 5-1.5 MG/5ML syrup    Sig: Take 5 mLs by mouth every 6 (six) hours as needed for cough.    Dispense:  90 mL    Refill:  0  . azithromycin (ZITHROMAX) 250 MG tablet    Sig: Take 1 tablet (250 mg total) by mouth daily. Take first 2 tablets together, then 1 every day until finished.    Dispense:  6 tablet    Refill:  0   Given duration of symptoms will treat as above. Cough medication sedation precautions. Discussed typical duration of symptoms. OTC symptom care as needed. Ensure adequate fluid intake and rest.  Follow-up Information    Lovena Neighboursiallo, Abdoulaye, MD.   Specialty:  Family Medicine Why:  As needed. Contact information: [address] [place] [address] [phone]        [facility]   Specialty:  Urgent Care Why:  As needed. Contact information: [address] [place] [id number] [phone]         Reviewed expectations re: course of current medical issues. Questions answered. Outlined signs and symptoms indicating need for more acute intervention. Patient verbalized understanding. After Visit Summary given.   SUBJECTIVE: History from: patient.  Sheryl Howell is a 62 y.o. female who presents with complaint of nasal congestion, post-nasal drainage, and a persistent dry cough. Onset abrupt, greater than 2 weeks ago. Overall with mild fatigue and without body aches. SOB: none. Wheezing: none. Fever: no. Overall normal PO intake without n/v. Sick contacts: no. Cough worse with exertion. No specific alleviating factors reported. Cough is affecting sleep now. OTC treatment: none  reported.  Immunization History  Administered Date(s) Administered  . Influenza,inj,Quad PF,6+ Mos 12/27/2013, 01/01/2018  . Influenza-Unspecified 12/16/2012, 11/30/2016  . Tdap 08/28/2016   Received flu shot this year: yes.  Social History   Tobacco Use  Smoking Status Never Smoker  Smokeless Tobacco Never Used   ROS: As per HPI. All other systems negative.   OBJECTIVE:  Vitals:   02/26/18 1648  BP: (!) 123/52  Pulse: 68  Resp: 18  Temp: 97.8 F (36.6 C)  TempSrc: Tympanic  SpO2: 98%    General appearance: alert HEENT: nasal congestion; clear runny nose; throat irritation secondary to post-nasal drainage; TMs clear Neck: supple without LAD CV: RRR Lungs: unlabored respirations, symmetrical air entry without wheezing; cough: moderate; able to speak in full sentences Skin: warm; dry Psychological: alert and cooperative; normal mood and affect  Allergies  Allergen Reactions  . Sulfa Antibiotics Nausea Only    Past Medical History:  Diagnosis Date  . Adverse effect of anesthetic    bp low-takes awhile to come up post op  . Anemia   . Anemia 04/09/2012   Hx of anemia  . Anxiety   . Cholelithiasis and acute cholecystitis with obstruction 04/09/2012  . Depression   . Hepatitis B    9/13-dx hep b, HB S ab + 04/07/2012  . Hx of anxiety disorder 04/09/2012  . Hx of seizure disorder 04/09/2012  . 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 Hypotension   . Nausea   . PONV (postoperative nausea and vomiting)   .  Seizures (HCC)    Family History  Problem Relation Age of Onset  . Heart disease Unknown   . Cancer Sister        Uterine  . Cancer Maternal Uncle   . Cancer Paternal Aunt        Breast  . Breast cancer Maternal Aunt    Social History   Socioeconomic History  . Marital status: Married    Spouse name: Not on file  . Number of children: Not on file  . Years of education: Not on file  . Highest education level: Not on file  Occupational History  .  Not on file  Social Needs  . Financial resource strain: Not on file  . Food insecurity:    Worry: Not on file    Inability: Not on file  . Transportation needs:    Medical: Not on file    Non-medical: Not on file  Tobacco Use  . Smoking status: Never Smoker  . Smokeless tobacco: Never Used  Substance and Sexual Activity  . Alcohol use: Yes    Comment: rarely  . Drug use: No  . Sexual activity: Not on file  Lifestyle  . Physical activity:    Days per week: Not on file    Minutes per session: Not on file  . Stress: Not on file  Relationships  . Social connections:    Talks on phone: Not on file    Gets together: Not on file    Attends religious service: Not on file    Active member of club or organization: Not on file    Attends meetings of clubs or organizations: Not on file    Relationship status: Not on file  . Intimate partner violence:    Fear of current or ex partner: Not on file    Emotionally abused: Not on file    Physically abused: Not on file    Forced sexual activity: Not on file  Other Topics Concern  . Not on file  Social History Narrative  . Not on file           Mardella Layman, MD 02/26/18 1731

## 2018-07-07 ENCOUNTER — Other Ambulatory Visit: Payer: Self-pay | Admitting: Family Medicine

## 2018-07-07 DIAGNOSIS — F419 Anxiety disorder, unspecified: Principal | ICD-10-CM

## 2018-07-07 DIAGNOSIS — F32A Depression, unspecified: Secondary | ICD-10-CM

## 2018-07-07 DIAGNOSIS — F329 Major depressive disorder, single episode, unspecified: Secondary | ICD-10-CM

## 2018-07-17 ENCOUNTER — Other Ambulatory Visit: Payer: Self-pay | Admitting: Family Medicine

## 2018-07-17 DIAGNOSIS — Z1231 Encounter for screening mammogram for malignant neoplasm of breast: Secondary | ICD-10-CM

## 2018-09-10 ENCOUNTER — Ambulatory Visit
Admission: RE | Admit: 2018-09-10 | Discharge: 2018-09-10 | Disposition: A | Discharge status: Home / Self Care | Payer: BC Managed Care – PPO | Source: Ambulatory Visit | Attending: Family Medicine | Admitting: Family Medicine

## 2018-09-10 ENCOUNTER — Other Ambulatory Visit: Payer: Self-pay

## 2018-09-10 DIAGNOSIS — Z1231 Encounter for screening mammogram for malignant neoplasm of breast: Secondary | ICD-10-CM

## 2018-10-16 ENCOUNTER — Other Ambulatory Visit: Payer: Self-pay

## 2018-10-16 DIAGNOSIS — F329 Major depressive disorder, single episode, unspecified: Secondary | ICD-10-CM

## 2018-10-16 DIAGNOSIS — F419 Anxiety disorder, unspecified: Secondary | ICD-10-CM

## 2018-10-16 MED ORDER — BUSPIRONE HCL 7.5 MG PO TABS: ORAL_TABLET | ORAL | 2 refills | Status: DC

## 2018-10-22 MED ORDER — BUSPIRONE HCL 7.5 MG PO TABS: ORAL_TABLET | ORAL | 2 refills | Status: DC

## 2018-10-22 NOTE — Addendum Note (Signed)
Addended by: Dorna Bloom on: 10/22/2018 10:43 AM   Modules accepted: Orders

## 2018-10-22 NOTE — Telephone Encounter (Signed)
Patient is requesting a 90 day supply

## 2018-12-03 ENCOUNTER — Other Ambulatory Visit: Payer: Self-pay | Admitting: *Deleted

## 2018-12-03 DIAGNOSIS — F419 Anxiety disorder, unspecified: Secondary | ICD-10-CM

## 2018-12-03 DIAGNOSIS — F329 Major depressive disorder, single episode, unspecified: Secondary | ICD-10-CM

## 2018-12-05 MED ORDER — CITALOPRAM HYDROBROMIDE 40 MG PO TABS: 40.0000 mg | ORAL_TABLET | Freq: Every day | ORAL | 2 refills | Status: DC

## 2018-12-21 ENCOUNTER — Other Ambulatory Visit: Payer: Self-pay | Admitting: Family Medicine

## 2018-12-21 DIAGNOSIS — F32A Depression, unspecified: Secondary | ICD-10-CM

## 2018-12-21 DIAGNOSIS — F329 Major depressive disorder, single episode, unspecified: Secondary | ICD-10-CM

## 2018-12-21 MED ORDER — CITALOPRAM HYDROBROMIDE 20 MG PO TABS: 20.0000 mg | ORAL_TABLET | Freq: Every day | ORAL | 2 refills | Status: DC

## 2019-01-22 ENCOUNTER — Other Ambulatory Visit: Payer: Self-pay

## 2019-01-22 DIAGNOSIS — Z20822 Contact with and (suspected) exposure to covid-19: Secondary | ICD-10-CM

## 2019-01-23 LAB — NOVEL CORONAVIRUS, NAA: SARS-CoV-2, NAA: NOT DETECTED

## 2019-04-12 ENCOUNTER — Telehealth: Payer: Self-pay

## 2019-04-12 ENCOUNTER — Ambulatory Visit (INDEPENDENT_AMBULATORY_CARE_PROVIDER_SITE_OTHER): Payer: BC Managed Care – PPO | Admitting: Family Medicine

## 2019-04-12 ENCOUNTER — Encounter: Payer: Self-pay | Admitting: Family Medicine

## 2019-04-12 ENCOUNTER — Other Ambulatory Visit: Payer: Self-pay

## 2019-04-12 VITALS — BP 120/68 | HR 90 | Ht 63.0 in | Wt 169.0 lb

## 2019-04-12 DIAGNOSIS — Z1211 Encounter for screening for malignant neoplasm of colon: Secondary | ICD-10-CM

## 2019-04-12 DIAGNOSIS — H9202 Otalgia, left ear: Secondary | ICD-10-CM | POA: Diagnosis not present

## 2019-04-12 DIAGNOSIS — N9489 Other specified conditions associated with female genital organs and menstrual cycle: Secondary | ICD-10-CM | POA: Diagnosis not present

## 2019-04-12 DIAGNOSIS — Z0000 Encounter for general adult medical examination without abnormal findings: Secondary | ICD-10-CM | POA: Insufficient documentation

## 2019-04-12 LAB — POCT URINALYSIS DIP (MANUAL ENTRY)
Bilirubin, UA: NEGATIVE
Blood, UA: NEGATIVE
Glucose, UA: NEGATIVE mg/dL
Ketones, POC UA: NEGATIVE mg/dL
Leukocytes, UA: NEGATIVE
Nitrite, UA: NEGATIVE
Protein Ur, POC: NEGATIVE mg/dL
Spec Grav, UA: 1.025 (ref 1.010–1.025)
Urobilinogen, UA: 0.2 E.U./dL
pH, UA: 5.5 (ref 5.0–8.0)

## 2019-04-12 MED ORDER — FLUTICASONE PROPIONATE 50 MCG/ACT NA SUSP: 2.0000 | Freq: Every day | NASAL | 6 refills | Status: DC

## 2019-04-12 NOTE — Telephone Encounter (Signed)
Called pt for a 2nd time. To inform of appt at GI [address]. Jan. 28th at 2:40pm. Pt is to drink 32oz of water 1 hr before appt time. Pt must have a full bladder. Aquilla Solian, CMA

## 2019-04-12 NOTE — Assessment & Plan Note (Addendum)
Left lower quadrant pain could be due to ovarian or GI etiology.  Patient does have a history of laparoscopic abdominal surgery, pain could be due to adhesions. -Recommend Tylenol 500 mg and Advil 500 mg every 6 hours together as needed for pain -Patient for pelvic ultrasound with transvaginal view to rule out ovarian etiology -Patient can use heating pad for pain -We will follow-up with patient regarding imaging -We will check patient's urine to make sure she is not having UTI

## 2019-04-12 NOTE — Progress Notes (Signed)
   Subjective:    Patient ID: Sheryl Howell, female    DOB: 08-30-1955, 64 y.o.   MRN: [id number]   CC: Pain around left ovary, sharp pain in ear  HPI: Left lower quadrant abdominal pain: Patient has concern for left ovarian pain.  The patient has a complex history with infertility and treatments in attempt to conceive.  Unfortunately, these attempts were unsuccessful.  She states she has had long-term issues with her left ovary, including issues with follicles failing to rupture and decreased ovulation from the left side.  The pain she has now mimics the pain she used to have with her left ovary when it was trying to ovulate.  Over the last couple of months she has been getting a sharp stabbing pain in the left lower quadrant, couple times the pain was so severe she consider going to the emergency department.  The pain oftentimes kicks in and remained constant for days.  She denies any vaginal bleeding, bleeding in her stools, diarrhea, constipation, or changes in the pain with stooling or urination.  There is no abdominal imaging of the patient's chart except an ultrasound from 2017 (3 years ago). Patient is in menopause. Last Pap May 2017 was normal.   Sharp pain in left ear: Patient presents today with concerns for sharp pains in her left ear.  The started multiple months ago.  She states the pain medicine issues.  She states loratadine 10 mg to help improve her symptoms and decrease nasal drainage, sneezing, etc. Her ear feels like someone is poking it. No drainage. Has not tried any ear drops.  Smoking status reviewed - previous smoker from college  Review of Systems -see HPI   Objective:  BP 120/68   Pulse 90   Ht 5\' 3"  (1.6 m)   Wt 169 lb (76.7 kg)   SpO2 97%   BMI 29.94 kg/m  Vitals and nursing note reviewed  Physical exam General: well nourished, in no acute distress HEENT: Normal-appearing tympanic membranes bilaterally, no cerumen or foreign body appreciated  bilateral external auditory canals Cardiac: RRR, clear S1 and S2, no murmurs, rubs, or gallops Respiratory: clear to auscultation bilaterally, no increased work of breathing Abdomen: soft, nontender, nondistended, no masses or organomegaly. Bowel sounds present Extremities: no edema or cyanosis. Warm, well perfused. 2+ radial and PT pulses bilaterally  Assessment & Plan:   Pain of ovary Left lower quadrant pain could be due to ovarian or GI etiology.  Patient does have a history of laparoscopic abdominal surgery, pain could be due to adhesions. -Recommend Tylenol 500 mg and Advil 500 mg every 6 hours together as needed for pain -Patient for pelvic ultrasound with transvaginal view to rule out ovarian etiology -Patient can use heating pad for pain -We will follow-up with patient regarding imaging -We will check patient's urine to make sure she is not having UTI  Encounter for screening colonoscopy Patient's last colonoscopy was March 2009 while living in [place], [place]. -Patient is due for colonoscopy -GI referral made, colonoscopy ordered  Ear pain, left Patient experiencing left ear pain, pain tends to coincide with her allergies/sinus symptoms.  Patient is currently treating her symptoms with loratadine which is minimally helpful. -Recommended Flonase nasal spray to each nostril every evening -Patient can also consider using over-the-counter swimmer's ear drops to help with pain   Return if symptoms worsen or fail to improve.   Dr. [place] [facility], PGY-2

## 2019-04-12 NOTE — Patient Instructions (Addendum)
Thank you for coming in to see Sheryl Howell today! Please see below to review our plan for today's visit:  1. We are scheduling you for imaging of your L ovary. You can take Tylenol 500mg  with Advil 500mg  every 6 hours as needed for pain. Also feel free to use a heating pad to help the area have less pain. 2. Flonase 1 spray to each nostril every evening.   Please call the clinic at [phone] if your symptoms worsen or you have any concerns. It was our pleasure to serve you!   Dr. [facility]

## 2019-04-12 NOTE — Assessment & Plan Note (Signed)
Patient experiencing left ear pain, pain tends to coincide with her allergies/sinus symptoms.  Patient is currently treating her symptoms with loratadine which is minimally helpful. -Recommended Flonase nasal spray to each nostril every evening -Patient can also consider using over-the-counter swimmer's ear drops to help with pain

## 2019-04-12 NOTE — Assessment & Plan Note (Signed)
Patient's last colonoscopy was March 2009 while living in [place], [place]. -Patient is due for colonoscopy -GI referral made, colonoscopy ordered

## 2019-04-15 ENCOUNTER — Ambulatory Visit
Admission: RE | Admit: 2019-04-15 | Discharge: 2019-04-15 | Disposition: A | Discharge status: Home / Self Care | Payer: BC Managed Care – PPO | Source: Ambulatory Visit | Attending: Family Medicine | Admitting: Family Medicine

## 2019-04-15 DIAGNOSIS — N9489 Other specified conditions associated with female genital organs and menstrual cycle: Secondary | ICD-10-CM

## 2019-04-21 ENCOUNTER — Telehealth: Payer: Self-pay

## 2019-04-21 NOTE — Telephone Encounter (Signed)
Sheryl Howell, over at [facility], calls nurse line to report colonoscopy completion for patient. A referral for colonoscopy was sent to Dr. Louis Matte officer earlier this year, and per their records, the patient had a normal colonoscopy on 08/05/2017. Patient is not due for another one until May 2029. I did request a copy and I updated her HM.

## 2019-04-30 ENCOUNTER — Telehealth: Payer: Self-pay

## 2019-04-30 NOTE — Telephone Encounter (Signed)
Patient calls nursing line requesting ultrasound results from 04/15/2019.   Please return call to patient at [phone]  Veronda Prude, RN

## 2019-05-03 NOTE — Telephone Encounter (Signed)
Patient contacted at home, number [phone].  Used 2 identifiers to confirm speaking with the right patient.  Patient informed of results from April 15, 2019 pelvic ultrasound that was negative for adnexal mass, cyst, or abnormal blood flow to the ovaries, as well as negative for any explanation as to patient's left lower quadrant pain.  Small amount of fluid was seen in uterine fundus; however, in setting of normal endometrial stripe (1 mm) and no vaginal bleeding from patient, no further work-up needs to be done.  Patient was instructed to contact the clinic should she experience the symptoms again and to seek help should she experience nausea, vomiting, abdominal pain, worsening left lower quadrant pain, rectal bleeding, vaginal bleeding, night sweats, and/or weight loss.  Patient voiced understanding and agreement with plan.   Peggyann Shoals, DO [facility], PGY-2 05/03/2019 9:41 AM

## 2019-08-17 ENCOUNTER — Other Ambulatory Visit: Payer: Self-pay | Admitting: Family Medicine

## 2019-08-17 DIAGNOSIS — Z1231 Encounter for screening mammogram for malignant neoplasm of breast: Secondary | ICD-10-CM

## 2019-08-18 ENCOUNTER — Encounter: Payer: Self-pay | Admitting: Family Medicine

## 2019-09-08 ENCOUNTER — Other Ambulatory Visit: Payer: Self-pay | Admitting: *Deleted

## 2019-09-08 DIAGNOSIS — F32A Depression, unspecified: Secondary | ICD-10-CM

## 2019-09-08 MED ORDER — CITALOPRAM HYDROBROMIDE 20 MG PO TABS: 20.0000 mg | ORAL_TABLET | Freq: Every day | ORAL | 2 refills | Status: DC

## 2019-09-08 NOTE — Addendum Note (Signed)
Addended by: Henri Medal on: 09/08/2019 09:10 AM   Modules accepted: Orders

## 2019-09-08 NOTE — Telephone Encounter (Signed)
Medication was originally approved for refill as no print.  Changed to normal and sent it electronically to pharmacy.  Yuniel Blaney,CMA

## 2019-09-15 ENCOUNTER — Ambulatory Visit
Admission: RE | Admit: 2019-09-15 | Discharge: 2019-09-15 | Disposition: A | Discharge status: Home / Self Care | Payer: BC Managed Care – PPO | Source: Ambulatory Visit | Attending: Family Medicine | Admitting: Family Medicine

## 2019-09-15 ENCOUNTER — Other Ambulatory Visit: Payer: Self-pay | Admitting: Family Medicine

## 2019-09-15 ENCOUNTER — Ambulatory Visit: Payer: BC Managed Care – PPO

## 2019-09-15 ENCOUNTER — Other Ambulatory Visit: Payer: Self-pay

## 2019-09-15 DIAGNOSIS — Z1231 Encounter for screening mammogram for malignant neoplasm of breast: Secondary | ICD-10-CM

## 2019-09-16 ENCOUNTER — Ambulatory Visit: Payer: BC Managed Care – PPO | Admitting: Family Medicine

## 2019-09-22 ENCOUNTER — Ambulatory Visit: Payer: BC Managed Care – PPO | Admitting: Family Medicine

## 2019-11-03 ENCOUNTER — Other Ambulatory Visit: Payer: Self-pay | Admitting: Family Medicine

## 2019-11-03 DIAGNOSIS — F329 Major depressive disorder, single episode, unspecified: Secondary | ICD-10-CM

## 2019-11-03 DIAGNOSIS — F419 Anxiety disorder, unspecified: Secondary | ICD-10-CM

## 2019-11-18 ENCOUNTER — Ambulatory Visit (INDEPENDENT_AMBULATORY_CARE_PROVIDER_SITE_OTHER): Payer: BC Managed Care – PPO | Admitting: Family Medicine

## 2019-11-18 ENCOUNTER — Other Ambulatory Visit: Payer: Self-pay

## 2019-11-18 VITALS — BP 120/60 | HR 74

## 2019-11-18 DIAGNOSIS — Z1211 Encounter for screening for malignant neoplasm of colon: Secondary | ICD-10-CM | POA: Diagnosis not present

## 2019-11-18 DIAGNOSIS — M1990 Unspecified osteoarthritis, unspecified site: Secondary | ICD-10-CM | POA: Diagnosis not present

## 2019-11-18 NOTE — Patient Instructions (Addendum)
Thank you for coming in to see [place] today! Please see below to review our plan for today's visit:  1. Call insurance about prices for labs: R hand Xray [phone]), Sed Rate/ESR (NLW787), CRP (Lab 149), ANA with Reflex ([id number]), and Rheumatoid Factor (HQI164). 2. We have referred you to GI for a colonoscopy.  3. Follow up 4-6 weeks. 4. For pain you take Tylenol 559m with Ibuprofen 2034mup to 3-4 times daily.   Please call the clinic at [phone] your symptoms worsen or you have any concerns. It was our pleasure to serve you!   Dr. HaMilus [facility]

## 2019-11-18 NOTE — Progress Notes (Signed)
SUBJECTIVE:   CHIEF COMPLAINT / HPI:   Arthritic Pains: This is a pleasant patient presenting to clinic today for concerns with recent "flareups" of pain in her right wrist, right thumb base, index finger, and pinky finger.  Describes the sensation as a burning and swelling in the knuckles (PIP) with associated decreased range of motion.  Patient states her index finger and pinky finger looks like "sausages".  She reports that she has had a significant history of arthritic pains in the past, but since Sunday these to flareup happened.  Each flareup in each finger occurred at different times and lasted about 2 days.  The patient is right-handed.  When the flareup happened she was driving, denies any injuries, trauma, falls, bug bites.  She denies any gelling sensation for her joints in the mornings, denies any particular pattern regarding time of day or activity with her pain.  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  When the pains flared up she took 800 mg of ibuprofen, use a hot/wet compress on the joint and also applied Voltaren gel.  Mom and sister both have a history of arthritis.  Colonoscopy: Patient is due for colonoscopy, referral placed today's visit.  Health maintenance: due for Pap smear and Flu shot and colonoscopy   PERTINENT  PMH / PSH:  Patient Active Problem List   Diagnosis Date Noted  . Arthritis 11/18/2019  . Pain of ovary 04/12/2019  . Encounter for screening colonoscopy 04/12/2019  . Ear pain, left 04/12/2019  . Decreased hearing of both ears 01/01/2018  . Vomiting and diarrhea 02/18/2017  . Dizziness 02/05/2016  . Chronic fatigue 12/14/2015  . Olecranon bursitis of right elbow 10/23/2015  . Hyperlipidemia 06/29/2014  . Sebaceous cyst 02/16/2014  . 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 Hx of seizure disorder 04/09/2012  . Anemia 04/09/2012  . Symptomatic cholelithiasis 04/07/2012  . Acute hepatitis B 11/29/2011  .  Overweight(278.02) 05/27/2011  . INSOMNIA 10/11/2009  . Inguinal pain, left 10/11/2009  . Anxiety and depression 09/14/2009  . 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 No swelling, erythema or bruising Palpation: no TTP ROM: Full ROM of the digits and wrist Strength: 5/5 strength in the forearm, wrist and interosseus muscles Neurovascular: NV intact   ASSESSMENT/PLAN:   Encounter for screening colonoscopy Patient due for colonoscopy. -Referral placed to GI  Arthritis Patient experiencing pain of right index PIP and right fifth digit PIP, as well as occasional MCP and right wrist.  Would like to do labs, imaging however unsure how expensive work-up will be.  Patient given the following instructions: 1. Call insurance about prices for labs: R hand Xray ([id number]), Sed Rate/ESR (Lab322), CRP (Lab 149), ANA with Reflex (Lab4475), and Rheumatoid Factor (Lab206). 2. For pain you take Tylenol 500mg with Ibuprofen 200mg up to 3-4 times daily 3. Follow up 4-6 weeks or sooner as needed.     Hannah C Anderson, DO [facility]  

## 2019-11-18 NOTE — Assessment & Plan Note (Signed)
Patient experiencing pain of right index PIP and right fifth digit PIP, as well as occasional MCP and right wrist.  Would like to do labs, imaging however unsure how expensive work-up will be.  Patient given the following instructions: 1. Call insurance about prices for labs: R hand Xray ([id number]), Sed Rate/ESR (Lab322), CRP (Lab 149), ANA with Reflex (Lab4475), and Rheumatoid Factor (Lab206). 2. For pain you take Tylenol 500mg with Ibuprofen 200mg up to 3-4 times daily 3. Follow up 4-6 weeks or sooner as needed. 

## 2019-11-18 NOTE — Assessment & Plan Note (Signed)
Patient due for colonoscopy.  Referral placed to GI. 

## 2019-12-07 ENCOUNTER — Encounter: Payer: Self-pay | Admitting: Family Medicine

## 2019-12-29 ENCOUNTER — Encounter: Payer: Self-pay | Admitting: Internal Medicine

## 2020-01-26 ENCOUNTER — Encounter: Payer: Self-pay | Admitting: Family Medicine

## 2020-02-02 ENCOUNTER — Telehealth: Payer: Self-pay | Admitting: *Deleted

## 2020-02-02 NOTE — Telephone Encounter (Signed)
Colonoscopy with Dr. Bosie Clos in 2019 was fair prep clearing to good with irrigation. He said good prep and complete exam without polyps He recommended a 10 yr recall  If not family history of advanced colon polyps or colon cancer, then 10 year recall is appropriate Thanks

## 2020-02-02 NOTE — Telephone Encounter (Signed)
DR PYRTLE,  THIS PT IS [name] FOR A COLON 12-16 WITH YOU- SHE HAD AN OV 2013 WITH YOU- HER LAST COLON IN Epic WAS 08-05-2017 AT [facility]- SHE HAD AN ADEQ/FAIR PREP, TICS AND HEMS, NO POLYPS WITH A 10 YR RECALL COLON- SHE HAS BEEN SCHEDULED DIRECT FOR A COLON WITH YOU.  DOES SHE NEED COLON NOW, OR 2029 ( 10 YR RECALL )- WE RECEIVED A REF FROM HER PCP FOR A SCREENING COLON   PLEASE ADVISE, Sheryl Howell

## 2020-02-02 NOTE — Telephone Encounter (Signed)
Called pt- she states she had a colon in 2009- explained to her there is a scanned colon in Epic done 08-05-2017 Dr Bosie Clos ,Deboraha Sprang- date, time, dr ,results and pt denies colon in 2019- she has no [facility]-  she asked me to print and mail procedure to her, she will call [facility], confirm ,  and let us know if she needs to cancel her 12-2 PV and her 12-16 Colon-  Verified address  explained to pt if she had colon she needs to call and cancel= she verbalized understanding   Hilda Lias PV

## 2020-03-02 ENCOUNTER — Encounter: Payer: BC Managed Care – PPO | Admitting: Internal Medicine

## 2020-06-19 ENCOUNTER — Telehealth: Payer: Self-pay

## 2020-06-19 NOTE — Telephone Encounter (Signed)
Patient calls nurse line requesting to schedule appointment for "pain with deep breaths." Patient states that she had "an episode" about two months ago, when she was out eating dinner. Patient states that she began having pain with deep breaths and then her "heart was hurting" for about thirty minutes after issue with breathing. Patient did not go to ED or have further evaluation. Symptoms resolved without intervention.  Patient reports that four days ago she started noticing pain under rib cage with deep breaths, no associated SHOB or radiating pain.   Patient is currently asymptomatic, however, would like to be evaluated for concern. Scheduled patient with PCP on Wednesday, 4/6. Provided with strict ED precautions.   Veronda Prude, RN

## 2020-06-21 ENCOUNTER — Ambulatory Visit (INDEPENDENT_AMBULATORY_CARE_PROVIDER_SITE_OTHER): Payer: BC Managed Care – PPO | Admitting: Family Medicine

## 2020-06-21 ENCOUNTER — Other Ambulatory Visit: Payer: Self-pay

## 2020-06-21 ENCOUNTER — Encounter: Payer: Self-pay | Admitting: Family Medicine

## 2020-06-21 VITALS — BP 120/60 | HR 74 | Ht 63.0 in | Wt 163.1 lb

## 2020-06-21 DIAGNOSIS — R0789 Other chest pain: Secondary | ICD-10-CM | POA: Diagnosis not present

## 2020-06-21 NOTE — Patient Instructions (Signed)
Thank you for coming in to see [place] today! Please see below to review our plan for today's visit:  1. I believe you're having Precordial Catch Syndrome and Diaphragmatic spasm/cramps. 2. You can treat with good hydration, warm compresses. You can also try Tylenol/Ibuprofen.  3. If you're having chest pain with abdominal pain, or other concerning signs please seek emergency medical care.   Please call the clinic at [phone] if your symptoms worsen or you have any concerns. It was our pleasure to serve you!   Dr. Peggyann Shoals [facility]

## 2020-06-25 DIAGNOSIS — R0789 Other chest pain: Secondary | ICD-10-CM | POA: Insufficient documentation

## 2020-06-25 NOTE — Progress Notes (Signed)
    SUBJECTIVE:   CHIEF COMPLAINT / HPI:   Noncardiac Chest pain: patient states that about 2 months ago she felt tightness on her upper abdomen. She says the pain is not sharp but says "it just hurts". A couple times she reports that she had pain that felt as if it was around her heart. The pain lasted about 30 minutes each time. The pain does not improve or worsen with walking, exercise, or position changes (such as leaning forward). On Saturday she had pain with taking a deep breath. The pain does not change with eating. She denies nausea, vomiting and diarrhea.  PERTINENT  PMH / PSH:  Patient Active Problem List   Diagnosis Date Noted  . Non-cardiac chest pain 06/25/2020  . Arthritis 11/18/2019  . Pain of ovary 04/12/2019  . Screen for colon cancer 04/12/2019  . Ear pain, left 04/12/2019  . Decreased hearing of both ears 01/01/2018  . Vomiting and diarrhea 02/18/2017  . Dizziness 02/05/2016  . Chronic fatigue 12/14/2015  . Olecranon bursitis of right elbow 10/23/2015  . Hyperlipidemia 06/29/2014  . Sebaceous cyst 02/16/2014  . 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 Hx of seizure disorder 04/09/2012  . Anemia 04/09/2012  . Symptomatic cholelithiasis 04/07/2012  . Acute hepatitis B 11/29/2011  . Overweight(278.02) 05/27/2011  . INSOMNIA 10/11/2009  . Inguinal pain, left 10/11/2009  . Anxiety and depression 09/14/2009  . 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   ASSESSMENT/PLAN:   Non-cardiac chest pain Patient experiencing non-cardiac chest pain, differential includes tightness of diaphragm/diaphragmatic restriction, Precordial Catch Syndrome, and Costochondritis. Has been having symptoms on and off for 2 months. Not improved/worsened by activity. Overall reassuring.  1. Recommend treating with good hydration, warm compresses. Can also try Tylenol/Ibuprofen.  2. Strict return precautions provided regarding chest pain, jaw pain, and/or abdominal pain with activity, amongst others.     , DO [facility]

## 2020-06-25 NOTE — Assessment & Plan Note (Signed)
Patient experiencing non-cardiac chest pain, differential includes tightness of diaphragm/diaphragmatic restriction, Precordial Catch Syndrome, and Costochondritis. Has been having symptoms on and off for 2 months. Not improved/worsened by activity. Overall reassuring.  1. Recommend treating with good hydration, warm compresses. Can also try Tylenol/Ibuprofen.  2. Strict return precautions provided regarding chest pain, jaw pain, and/or abdominal pain with activity, amongst others.

## 2020-07-13 ENCOUNTER — Encounter: Payer: Self-pay | Admitting: Family Medicine

## 2020-07-24 ENCOUNTER — Other Ambulatory Visit: Payer: Self-pay | Admitting: Family Medicine

## 2020-07-24 DIAGNOSIS — F419 Anxiety disorder, unspecified: Secondary | ICD-10-CM

## 2020-07-24 DIAGNOSIS — F32A Depression, unspecified: Secondary | ICD-10-CM

## 2020-08-01 ENCOUNTER — Other Ambulatory Visit: Payer: Self-pay | Admitting: Family Medicine

## 2020-08-01 DIAGNOSIS — Z1231 Encounter for screening mammogram for malignant neoplasm of breast: Secondary | ICD-10-CM

## 2020-08-28 ENCOUNTER — Other Ambulatory Visit: Payer: Self-pay | Admitting: Family Medicine

## 2020-08-28 DIAGNOSIS — F419 Anxiety disorder, unspecified: Secondary | ICD-10-CM

## 2020-09-27 ENCOUNTER — Ambulatory Visit: Payer: BC Managed Care – PPO

## 2020-09-30 ENCOUNTER — Ambulatory Visit
Admission: RE | Admit: 2020-09-30 | Discharge: 2020-09-30 | Disposition: A | Discharge status: Home / Self Care | Payer: BC Managed Care – PPO | Source: Ambulatory Visit | Attending: *Deleted | Admitting: *Deleted

## 2020-09-30 DIAGNOSIS — Z1231 Encounter for screening mammogram for malignant neoplasm of breast: Secondary | ICD-10-CM

## 2020-12-14 ENCOUNTER — Encounter: Payer: Self-pay | Admitting: Student

## 2020-12-14 ENCOUNTER — Ambulatory Visit (INDEPENDENT_AMBULATORY_CARE_PROVIDER_SITE_OTHER): Payer: Medicare PPO | Admitting: Student

## 2020-12-14 ENCOUNTER — Other Ambulatory Visit (HOSPITAL_COMMUNITY)
Admission: RE | Admit: 2020-12-14 | Discharge: 2020-12-14 | Disposition: A | Discharge status: Home / Self Care | Payer: Medicare PPO | Source: Ambulatory Visit | Attending: Family Medicine | Admitting: Family Medicine

## 2020-12-14 ENCOUNTER — Other Ambulatory Visit: Payer: Self-pay

## 2020-12-14 VITALS — BP 124/70 | HR 72 | Ht 63.0 in | Wt 162.6 lb

## 2020-12-14 DIAGNOSIS — Z23 Encounter for immunization: Secondary | ICD-10-CM | POA: Diagnosis not present

## 2020-12-14 DIAGNOSIS — Z0000 Encounter for general adult medical examination without abnormal findings: Secondary | ICD-10-CM

## 2020-12-14 DIAGNOSIS — F419 Anxiety disorder, unspecified: Secondary | ICD-10-CM | POA: Diagnosis not present

## 2020-12-14 DIAGNOSIS — F32A Depression, unspecified: Secondary | ICD-10-CM | POA: Diagnosis not present

## 2020-12-14 NOTE — Assessment & Plan Note (Signed)
ITG5-49. Pt is currently seeing and therapist and is on a medication regimen of Bupropion, Buspar, and Citalopram. She notes that the stress she is under now is situational, but she thinks that her therapist will be helpful. No HI/SI.

## 2020-12-14 NOTE — Patient Instructions (Addendum)
It was nice to see you today!  Continue with your bupropion, citalopram, and buspirone.  I hope that the therapist goes well for you today.  We will have your Pap smear results, and I will contact you with them.  If you need anything, please let me know.   Happy belated birthday!

## 2020-12-14 NOTE — Assessment & Plan Note (Signed)
Pt is receiving her flu shot and tolerated her pap smear well. Will follow up with the results.

## 2020-12-14 NOTE — Progress Notes (Signed)
    SUBJECTIVE:   CHIEF COMPLAINT / HPI:   Health Maintenance: Pt would like a pap smear today. Pt declined STD testing and bimanual exam.   Depression: She is on medication: buspar, bupropion, citalopram and feels that it is helpful. Pt is seeing a therapist for her depression. PHQ9 was 12. Pt reports that she has her daughter back at home now, and this is causing stress on her and her husband. Pt reports that her daughter has BPD and is difficult to handle.    PERTINENT  PMH / PSH: Hx hep B, HLD, anxiety/depression, hx of seizure disorder, anemia    OBJECTIVE:   BP 124/70   Pulse 72   Ht 5\' 3"  (1.6 m)   Wt 162 lb 9.6 oz (73.8 kg)   SpO2 97%   BMI 28.80 kg/m   Physical Exam Vitals reviewed.  Constitutional:      General: She is not in acute distress.    Appearance: She is not ill-appearing.  Pulmonary:     Effort: Pulmonary effort is normal.  Genitourinary:    Comments: Age related vaginal atrophy. No evident external masses, ulcers, or patches. No discharge on cervical os.  Neurological:     Mental Status: She is alert.  Psychiatric:        Mood and Affect: Mood normal.        Behavior: Behavior normal.    ASSESSMENT/PLAN:   Anxiety and depression PHQ9-12. Pt is currently seeing and therapist and is on a medication regimen of Bupropion, Buspar, and Citalopram. She notes that the stress she is under now is situational, but she thinks that her therapist will be helpful. No HI/SI.   Healthcare maintenance Pt is receiving her flu shot and tolerated her pap smear well. Will follow up with the results.     , MD [facility]

## 2020-12-15 LAB — CYTOLOGY - PAP
Comment: NEGATIVE
Diagnosis: NEGATIVE
High risk HPV: NEGATIVE

## 2021-02-21 DIAGNOSIS — F411 Generalized anxiety disorder: Secondary | ICD-10-CM | POA: Diagnosis not present

## 2021-02-21 DIAGNOSIS — F331 Major depressive disorder, recurrent, moderate: Secondary | ICD-10-CM | POA: Diagnosis not present

## 2021-02-22 DIAGNOSIS — F1010 Alcohol abuse, uncomplicated: Secondary | ICD-10-CM | POA: Diagnosis not present

## 2021-02-22 DIAGNOSIS — F411 Generalized anxiety disorder: Secondary | ICD-10-CM | POA: Diagnosis not present

## 2021-02-22 DIAGNOSIS — F331 Major depressive disorder, recurrent, moderate: Secondary | ICD-10-CM | POA: Diagnosis not present

## 2021-02-22 DIAGNOSIS — F430 Acute stress reaction: Secondary | ICD-10-CM | POA: Diagnosis not present

## 2021-02-28 DIAGNOSIS — F331 Major depressive disorder, recurrent, moderate: Secondary | ICD-10-CM | POA: Diagnosis not present

## 2021-02-28 DIAGNOSIS — F411 Generalized anxiety disorder: Secondary | ICD-10-CM | POA: Diagnosis not present

## 2021-03-07 DIAGNOSIS — F331 Major depressive disorder, recurrent, moderate: Secondary | ICD-10-CM | POA: Diagnosis not present

## 2021-03-07 DIAGNOSIS — F411 Generalized anxiety disorder: Secondary | ICD-10-CM | POA: Diagnosis not present

## 2021-03-14 DIAGNOSIS — F331 Major depressive disorder, recurrent, moderate: Secondary | ICD-10-CM | POA: Diagnosis not present

## 2021-03-14 DIAGNOSIS — F411 Generalized anxiety disorder: Secondary | ICD-10-CM | POA: Diagnosis not present

## 2021-03-21 DIAGNOSIS — F411 Generalized anxiety disorder: Secondary | ICD-10-CM | POA: Diagnosis not present

## 2021-03-21 DIAGNOSIS — F1010 Alcohol abuse, uncomplicated: Secondary | ICD-10-CM | POA: Diagnosis not present

## 2021-03-21 DIAGNOSIS — F331 Major depressive disorder, recurrent, moderate: Secondary | ICD-10-CM | POA: Diagnosis not present

## 2021-03-28 DIAGNOSIS — F430 Acute stress reaction: Secondary | ICD-10-CM | POA: Diagnosis not present

## 2021-03-28 DIAGNOSIS — F331 Major depressive disorder, recurrent, moderate: Secondary | ICD-10-CM | POA: Diagnosis not present

## 2021-03-28 DIAGNOSIS — F411 Generalized anxiety disorder: Secondary | ICD-10-CM | POA: Diagnosis not present

## 2021-03-28 DIAGNOSIS — F1090 Alcohol use, unspecified, uncomplicated: Secondary | ICD-10-CM | POA: Diagnosis not present

## 2021-04-04 DIAGNOSIS — F331 Major depressive disorder, recurrent, moderate: Secondary | ICD-10-CM | POA: Diagnosis not present

## 2021-04-04 DIAGNOSIS — F411 Generalized anxiety disorder: Secondary | ICD-10-CM | POA: Diagnosis not present

## 2021-04-18 DIAGNOSIS — F331 Major depressive disorder, recurrent, moderate: Secondary | ICD-10-CM | POA: Diagnosis not present

## 2021-04-18 DIAGNOSIS — F411 Generalized anxiety disorder: Secondary | ICD-10-CM | POA: Diagnosis not present

## 2021-04-25 DIAGNOSIS — F411 Generalized anxiety disorder: Secondary | ICD-10-CM | POA: Diagnosis not present

## 2021-04-25 DIAGNOSIS — F331 Major depressive disorder, recurrent, moderate: Secondary | ICD-10-CM | POA: Diagnosis not present

## 2021-04-26 ENCOUNTER — Encounter: Payer: Self-pay | Admitting: Student

## 2021-04-30 ENCOUNTER — Other Ambulatory Visit: Payer: Self-pay

## 2021-04-30 ENCOUNTER — Ambulatory Visit: Payer: Medicare PPO

## 2021-04-30 ENCOUNTER — Ambulatory Visit: Payer: Medicare PPO | Admitting: Family Medicine

## 2021-04-30 DIAGNOSIS — Z111 Encounter for screening for respiratory tuberculosis: Secondary | ICD-10-CM

## 2021-04-30 NOTE — Telephone Encounter (Unsigned)
Pt in office today for a nurse visit for her hearing/vision/tb test.  Form to be completed by PCP.  Placed form in PCP's box for completion once lab is back.Vibha Ferdig Zimmerman Rumple, CMA

## 2021-04-30 NOTE — Progress Notes (Deleted)
    SUBJECTIVE:   CHIEF COMPLAINT / HPI:   ***  PERTINENT  PMH / PSH: ***  OBJECTIVE:   There were no vitals taken for this visit.  ***  ASSESSMENT/PLAN:   No problem-specific Assessment & Plan notes found for this encounter.     Mccayla Shimada E Jatavian Calica, MD [facility]  

## 2021-05-02 DIAGNOSIS — F331 Major depressive disorder, recurrent, moderate: Secondary | ICD-10-CM | POA: Diagnosis not present

## 2021-05-04 LAB — QUANTIFERON-TB GOLD PLUS
QuantiFERON Mitogen Value: >10 IU/mL
QuantiFERON Nil Value: 0 IU/mL
QuantiFERON TB1 Ag Value: 0 IU/mL
QuantiFERON TB2 Ag Value: 0.01 IU/mL
QuantiFERON-TB Gold Plus: NEGATIVE

## 2021-05-09 ENCOUNTER — Encounter: Payer: Self-pay | Admitting: Student

## 2021-05-09 DIAGNOSIS — F411 Generalized anxiety disorder: Secondary | ICD-10-CM | POA: Diagnosis not present

## 2021-05-09 DIAGNOSIS — F331 Major depressive disorder, recurrent, moderate: Secondary | ICD-10-CM | POA: Diagnosis not present

## 2021-05-15 ENCOUNTER — Ambulatory Visit: Payer: Medicare PPO

## 2021-05-17 ENCOUNTER — Ambulatory Visit: Payer: Medicare PPO | Admitting: Student

## 2021-05-23 DIAGNOSIS — F331 Major depressive disorder, recurrent, moderate: Secondary | ICD-10-CM | POA: Diagnosis not present

## 2021-05-23 DIAGNOSIS — F411 Generalized anxiety disorder: Secondary | ICD-10-CM | POA: Diagnosis not present

## 2021-05-29 DIAGNOSIS — F411 Generalized anxiety disorder: Secondary | ICD-10-CM | POA: Diagnosis not present

## 2021-05-29 DIAGNOSIS — F331 Major depressive disorder, recurrent, moderate: Secondary | ICD-10-CM | POA: Diagnosis not present

## 2021-06-12 DIAGNOSIS — F331 Major depressive disorder, recurrent, moderate: Secondary | ICD-10-CM | POA: Diagnosis not present

## 2021-06-12 DIAGNOSIS — F411 Generalized anxiety disorder: Secondary | ICD-10-CM | POA: Diagnosis not present

## 2021-06-20 DIAGNOSIS — F329 Major depressive disorder, single episode, unspecified: Secondary | ICD-10-CM | POA: Diagnosis not present

## 2021-06-20 DIAGNOSIS — Z85828 Personal history of other malignant neoplasm of skin: Secondary | ICD-10-CM | POA: Diagnosis not present

## 2021-06-20 DIAGNOSIS — F411 Generalized anxiety disorder: Secondary | ICD-10-CM | POA: Diagnosis not present

## 2021-06-20 DIAGNOSIS — F330 Major depressive disorder, recurrent, mild: Secondary | ICD-10-CM | POA: Diagnosis not present

## 2021-06-20 DIAGNOSIS — Z882 Allergy status to sulfonamides status: Secondary | ICD-10-CM | POA: Diagnosis not present

## 2021-06-20 DIAGNOSIS — Z803 Family history of malignant neoplasm of breast: Secondary | ICD-10-CM | POA: Diagnosis not present

## 2021-06-20 DIAGNOSIS — Z8249 Family history of ischemic heart disease and other diseases of the circulatory system: Secondary | ICD-10-CM | POA: Diagnosis not present

## 2021-06-27 DIAGNOSIS — F331 Major depressive disorder, recurrent, moderate: Secondary | ICD-10-CM | POA: Diagnosis not present

## 2021-07-10 DIAGNOSIS — F411 Generalized anxiety disorder: Secondary | ICD-10-CM | POA: Diagnosis not present

## 2021-07-10 DIAGNOSIS — F331 Major depressive disorder, recurrent, moderate: Secondary | ICD-10-CM | POA: Diagnosis not present

## 2021-07-25 DIAGNOSIS — F411 Generalized anxiety disorder: Secondary | ICD-10-CM | POA: Diagnosis not present

## 2021-07-25 DIAGNOSIS — F331 Major depressive disorder, recurrent, moderate: Secondary | ICD-10-CM | POA: Diagnosis not present

## 2021-08-10 DIAGNOSIS — F411 Generalized anxiety disorder: Secondary | ICD-10-CM | POA: Diagnosis not present

## 2021-08-10 DIAGNOSIS — F331 Major depressive disorder, recurrent, moderate: Secondary | ICD-10-CM | POA: Diagnosis not present

## 2021-08-13 DIAGNOSIS — F330 Major depressive disorder, recurrent, mild: Secondary | ICD-10-CM | POA: Diagnosis not present

## 2021-08-13 DIAGNOSIS — F411 Generalized anxiety disorder: Secondary | ICD-10-CM | POA: Diagnosis not present

## 2021-08-21 ENCOUNTER — Encounter: Payer: Self-pay | Admitting: *Deleted

## 2021-08-22 DIAGNOSIS — F331 Major depressive disorder, recurrent, moderate: Secondary | ICD-10-CM | POA: Diagnosis not present

## 2021-08-22 DIAGNOSIS — F411 Generalized anxiety disorder: Secondary | ICD-10-CM | POA: Diagnosis not present

## 2021-09-05 DIAGNOSIS — F330 Major depressive disorder, recurrent, mild: Secondary | ICD-10-CM | POA: Diagnosis not present

## 2021-09-06 DIAGNOSIS — Z85828 Personal history of other malignant neoplasm of skin: Secondary | ICD-10-CM | POA: Diagnosis not present

## 2021-09-06 DIAGNOSIS — L821 Other seborrheic keratosis: Secondary | ICD-10-CM | POA: Diagnosis not present

## 2021-09-06 DIAGNOSIS — C44529 Squamous cell carcinoma of skin of other part of trunk: Secondary | ICD-10-CM | POA: Diagnosis not present

## 2021-09-06 DIAGNOSIS — Z08 Encounter for follow-up examination after completed treatment for malignant neoplasm: Secondary | ICD-10-CM | POA: Diagnosis not present

## 2021-09-06 DIAGNOSIS — D225 Melanocytic nevi of trunk: Secondary | ICD-10-CM | POA: Diagnosis not present

## 2021-09-06 DIAGNOSIS — L814 Other melanin hyperpigmentation: Secondary | ICD-10-CM | POA: Diagnosis not present

## 2021-09-06 DIAGNOSIS — D485 Neoplasm of uncertain behavior of skin: Secondary | ICD-10-CM | POA: Diagnosis not present

## 2021-09-06 DIAGNOSIS — L570 Actinic keratosis: Secondary | ICD-10-CM | POA: Diagnosis not present

## 2021-10-03 DIAGNOSIS — D045 Carcinoma in situ of skin of trunk: Secondary | ICD-10-CM | POA: Diagnosis not present

## 2021-10-03 DIAGNOSIS — F411 Generalized anxiety disorder: Secondary | ICD-10-CM | POA: Diagnosis not present

## 2021-10-03 DIAGNOSIS — L821 Other seborrheic keratosis: Secondary | ICD-10-CM | POA: Diagnosis not present

## 2021-10-03 DIAGNOSIS — F331 Major depressive disorder, recurrent, moderate: Secondary | ICD-10-CM | POA: Diagnosis not present

## 2021-10-08 DIAGNOSIS — F330 Major depressive disorder, recurrent, mild: Secondary | ICD-10-CM | POA: Diagnosis not present

## 2021-10-08 DIAGNOSIS — F411 Generalized anxiety disorder: Secondary | ICD-10-CM | POA: Diagnosis not present

## 2021-10-17 DIAGNOSIS — F411 Generalized anxiety disorder: Secondary | ICD-10-CM | POA: Diagnosis not present

## 2021-10-17 DIAGNOSIS — F331 Major depressive disorder, recurrent, moderate: Secondary | ICD-10-CM | POA: Diagnosis not present

## 2021-10-30 ENCOUNTER — Other Ambulatory Visit: Payer: Self-pay | Admitting: Student

## 2021-10-30 DIAGNOSIS — Z1231 Encounter for screening mammogram for malignant neoplasm of breast: Secondary | ICD-10-CM

## 2021-11-14 DIAGNOSIS — F331 Major depressive disorder, recurrent, moderate: Secondary | ICD-10-CM | POA: Diagnosis not present

## 2021-11-14 DIAGNOSIS — F411 Generalized anxiety disorder: Secondary | ICD-10-CM | POA: Diagnosis not present

## 2021-11-16 ENCOUNTER — Ambulatory Visit: Payer: Medicare PPO

## 2021-11-28 DIAGNOSIS — F330 Major depressive disorder, recurrent, mild: Secondary | ICD-10-CM | POA: Diagnosis not present

## 2021-11-28 DIAGNOSIS — F411 Generalized anxiety disorder: Secondary | ICD-10-CM | POA: Diagnosis not present

## 2021-11-30 ENCOUNTER — Ambulatory Visit
Admission: RE | Admit: 2021-11-30 | Discharge: 2021-11-30 | Disposition: A | Discharge status: Home / Self Care | Payer: Medicare PPO | Source: Ambulatory Visit | Attending: Family Medicine | Admitting: Family Medicine

## 2021-11-30 DIAGNOSIS — Z1231 Encounter for screening mammogram for malignant neoplasm of breast: Secondary | ICD-10-CM

## 2021-12-12 DIAGNOSIS — F411 Generalized anxiety disorder: Secondary | ICD-10-CM | POA: Diagnosis not present

## 2021-12-12 DIAGNOSIS — F331 Major depressive disorder, recurrent, moderate: Secondary | ICD-10-CM | POA: Diagnosis not present

## 2021-12-31 DIAGNOSIS — F411 Generalized anxiety disorder: Secondary | ICD-10-CM | POA: Diagnosis not present

## 2021-12-31 DIAGNOSIS — F3341 Major depressive disorder, recurrent, in partial remission: Secondary | ICD-10-CM | POA: Diagnosis not present

## 2022-01-09 DIAGNOSIS — F330 Major depressive disorder, recurrent, mild: Secondary | ICD-10-CM | POA: Diagnosis not present

## 2022-01-09 DIAGNOSIS — F411 Generalized anxiety disorder: Secondary | ICD-10-CM | POA: Diagnosis not present

## 2022-01-23 DIAGNOSIS — F330 Major depressive disorder, recurrent, mild: Secondary | ICD-10-CM | POA: Diagnosis not present

## 2022-02-19 ENCOUNTER — Other Ambulatory Visit: Payer: Self-pay | Admitting: Student

## 2022-02-19 ENCOUNTER — Encounter: Payer: Self-pay | Admitting: Student

## 2022-02-19 MED ORDER — DICLOFENAC SODIUM 1 % EX GEL: 4.0000 g | Freq: Four times a day (QID) | CUTANEOUS | 1 refills | Status: DC

## 2022-02-20 DIAGNOSIS — F331 Major depressive disorder, recurrent, moderate: Secondary | ICD-10-CM | POA: Diagnosis not present

## 2022-03-06 DIAGNOSIS — F331 Major depressive disorder, recurrent, moderate: Secondary | ICD-10-CM | POA: Diagnosis not present

## 2022-03-31 ENCOUNTER — Encounter: Payer: Self-pay | Admitting: Student

## 2022-04-03 ENCOUNTER — Ambulatory Visit: Payer: Medicare PPO | Admitting: Family Medicine

## 2022-04-12 ENCOUNTER — Encounter: Payer: Self-pay | Admitting: Student

## 2022-04-12 ENCOUNTER — Ambulatory Visit (INDEPENDENT_AMBULATORY_CARE_PROVIDER_SITE_OTHER): Payer: Medicare PPO | Admitting: Student

## 2022-04-12 VITALS — BP 118/68 | HR 71 | Ht 63.0 in | Wt 173.0 lb

## 2022-04-12 DIAGNOSIS — M65311 Trigger thumb, right thumb: Secondary | ICD-10-CM | POA: Diagnosis not present

## 2022-04-12 DIAGNOSIS — M19049 Primary osteoarthritis, unspecified hand: Secondary | ICD-10-CM | POA: Diagnosis not present

## 2022-04-12 MED ORDER — MELOXICAM 7.5 MG PO TABS: 7.5000 mg | ORAL_TABLET | Freq: Every day | ORAL | 0 refills | Status: DC

## 2022-04-12 NOTE — Progress Notes (Signed)
    SUBJECTIVE:   CHIEF COMPLAINT / HPI:   Patient is a 67 year old female known history of arthritis of the left arm presenting today for concerns with the thumb. She has noticed the finger being stuck on flexion position and requiring passive lifting of the finger for relieve. She has had this pain for years but have gotten worse over the past 2 week. Its difficult to hold a pen or open any can. Currently work Product manager. Has used Voltragen without relieve or improvement. When pain is bad she has    PERTINENT  PMH / PSH: Reviewed   OBJECTIVE:   BP 118/68   Pulse 71   Ht 5\' 3"  (1.6 m)   Wt 173 lb (78.5 kg)   SpO2 96%   BMI 30.65 kg/m    Physical Exam General: Alert, well appearing, NAD, Oriented x4 Cardiovascular: RRR, No Murmurs, Normal S2/S2 Respiratory: CTAB, No wheezing or Rales Right thumb: No notable deformity, mild swelling on the tenia surface with surrounding tenderness with palpation. Normal flexion and extension.   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  Patient is consistent with Stenosing flexor tenosynovitis (trigger finger) -Encourage splinting using bandage over the thumb for at least a month -Rx daily meloxicam for 7 days -Obtain BMP to check kidney function -Discussed ultrasound guided steroid injection if no improvement or worsening symptoms.     Alen Bleacher, MD [place]

## 2022-04-12 NOTE — Patient Instructions (Addendum)
It was wonderful to meet you today. Thank you for allowing me to be a part of your care. Below is a short summary of what we discussed at your visit today:  I suspect your right thumb pain is most likely due to arthritis and also having trigger finger.  Have sent in prescription for an anti-inflammatory, meloxicam which you take once daily for 7 days.  I also recommend use of Band-Aids to apply around your toe as described to provide some splinting of the thumb to avoid continue flexion of that at home.  This should allow the time enough time to heal.  Please bring all of your medications to every appointment!  If you have any questions or concerns, please do not hesitate to contact us via phone or MyChart message.   Alen Bleacher, MD [facility]

## 2022-04-12 NOTE — Assessment & Plan Note (Signed)
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  Patient is consistent with Stenosing flexor tenosynovitis (trigger finger) -Encourage splinting using bandage over the thumb for at least a month -Rx daily meloxicam for 7 days -Obtain BMP to check kidney function -Discussed ultrasound guided steroid injection if no improvement or worsening symptoms.

## 2022-04-13 LAB — BASIC METABOLIC PANEL
BUN/Creatinine Ratio: 12 (ref 12–28)
BUN: 11 mg/dL (ref 8–27)
CO2: 23 mmol/L (ref 20–29)
Calcium: 9.2 mg/dL (ref 8.7–10.3)
Chloride: 103 mmol/L (ref 96–106)
Creatinine, Ser: 0.95 mg/dL (ref 0.57–1.00)
Glucose: 91 mg/dL (ref 70–99)
Potassium: 4.3 mmol/L (ref 3.5–5.2)
Sodium: 140 mmol/L (ref 134–144)
eGFR: 66 mL/min/{1.73_m2} (ref >59)

## 2022-04-14 ENCOUNTER — Encounter: Payer: Self-pay | Admitting: Student

## 2022-04-22 ENCOUNTER — Ambulatory Visit: Payer: Medicare PPO

## 2022-04-26 ENCOUNTER — Ambulatory Visit (INDEPENDENT_AMBULATORY_CARE_PROVIDER_SITE_OTHER): Payer: Medicare PPO

## 2022-04-26 DIAGNOSIS — Z0000 Encounter for general adult medical examination without abnormal findings: Secondary | ICD-10-CM

## 2022-04-26 DIAGNOSIS — F1090 Alcohol use, unspecified, uncomplicated: Secondary | ICD-10-CM | POA: Diagnosis not present

## 2022-04-26 DIAGNOSIS — E2839 Other primary ovarian failure: Secondary | ICD-10-CM | POA: Diagnosis not present

## 2022-04-26 DIAGNOSIS — F330 Major depressive disorder, recurrent, mild: Secondary | ICD-10-CM | POA: Diagnosis not present

## 2022-04-26 DIAGNOSIS — F411 Generalized anxiety disorder: Secondary | ICD-10-CM | POA: Diagnosis not present

## 2022-04-26 NOTE — Progress Notes (Signed)
I connected with  Sheryl Howell on 04/26/22 by a audio enabled telemedicine application and verified that I am speaking with the correct person using two identifiers.  Patient Location: Home  Provider location: Home Office   I discussed the limitations of evaluation and management by telemedicine. The patient expressed understanding and agreed to proceed.  Subjective:   Sheryl Howell is a 67 y.o. female who presents for an Initial Medicare Annual Wellness Visit.  Review of Systems    Per HPI unless specifically indicated below.  Cardiac Risk Factors include: advanced age (>40mn, >>45women);female gender, and Hyperlipidemia.        Objective:       04/12/2022    4:14 PM 12/14/2020    2:55 PM 06/21/2020    3:50 PM  Vitals with BMI  Height 5' 3"$  5' 3"$  5' 3"$   Weight 173 lbs 162 lbs 10 oz 163 lbs 2 oz  BMI 30.65 [id number] Systolic [id number] Diastolic 68 70 60  Pulse 71 72 74    Today's Vitals   04/26/22 1314  PainSc: 0-No pain   There is no height or weight on file to calculate BMI.     04/12/2022    4:16 PM 06/21/2020    3:50 PM 11/18/2019    8:58 AM 01/01/2018    2:04 PM 05/15/2017    3:20 PM 08/28/2016   10:32 AM 02/05/2016    9:45 AM  Advanced Directives  Does Patient Have a Medical Advance Directive? No No No No No Yes No  Type of Advance Directive      Living will   Does patient want to make changes to medical advance directive?      No - Patient declined   Would patient like information on creating a medical advance directive? No - Patient declined No - Patient declined No - Patient declined No - Patient declined No - Patient declined No - Patient declined No - patient declined information    Current Medications (verified) Outpatient Encounter Medications as of 04/26/2022  Medication Sig   buPROPion (WELLBUTRIN XL) 150 MG 24 hr tablet Take 150 mg by mouth daily.   busPIRone (BUSPAR) 5 MG tablet Take 5 mg by mouth 2 (two) times daily.    cholecalciferol (VITAMIN D) 400 units TABS tablet Take 400 Units by mouth daily.   citalopram (CELEXA) 20 MG tablet TAKE 1 TABLET BY MOUTH EVERY DAY (Patient taking differently: Take 40 mg by mouth daily.)   diclofenac Sodium (VOLTAREN) 1 % GEL Apply 4 g topically 4 (four) times daily.   fluticasone (FLONASE) 50 MCG/ACT nasal spray Place 2 sprays into both nostrils daily.   [DISCONTINUED] aspirin EC 81 MG tablet Take 81 mg by mouth daily. (Patient not taking: Reported on 12/14/2020)   [DISCONTINUED] azithromycin (ZITHROMAX) 250 MG tablet Take 1 tablet (250 mg total) by mouth daily. Take first 2 tablets together, then 1 every day until finished. (Patient not taking: Reported on 12/14/2020)   [DISCONTINUED] busPIRone (BUSPAR) 7.5 MG tablet TAKE 1 TABLET BY MOUTH EVERY DAY (Patient not taking: Reported on 04/26/2022)   [DISCONTINUED] HYDROcodone-homatropine (HYCODAN) 5-1.5 MG/5ML syrup Take 5 mLs by mouth every 6 (six) hours as needed for cough. (Patient not taking: Reported on 12/14/2020)   [DISCONTINUED] ibuprofen (ADVIL,MOTRIN) 600 MG tablet Take 1 tablet (600 mg total) by mouth every 6 (six) hours as needed. (Patient not taking: Reported on 12/14/2020)   [DISCONTINUED] meloxicam (MOBIC) 7.5 MG tablet Take 1 tablet (7.5  mg total) by mouth daily. (Patient not taking: Reported on 04/26/2022)   [DISCONTINUED] Multiple Vitamin (MULTIVITAMIN WITH MINERALS) TABS Take 1 tablet by mouth daily. (Patient not taking: Reported on 12/14/2020)   No facility-administered encounter medications on file as of 04/26/2022.    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   Hypotension    Nausea    PONV (postoperative nausea and vomiting)    Seizures ([place])    Past Surgical History:  Procedure Laterality Date   BUNIONECTOMY     both feet   CHOLECYSTECTOMY  04/08/2012   Procedure: LAPAROSCOPIC CHOLECYSTECTOMY WITH INTRAOPERATIVE CHOLANGIOGRAM;  Surgeon: Rolm Bookbinder, MD;  Location: [facility];  Service: General;  Laterality: Right;  C-  Arm   COLONOSCOPY     DIAGNOSTIC LAPAROSCOPY  1992   exp lap fertility   ERCP  04/09/2012   Procedure: ENDOSCOPIC RETROGRADE CHOLANGIOPANCREATOGRAPHY (ERCP);  Surgeon: Gatha Mayer, MD;  Location: [facility];  Service: Endoscopy;  Laterality: N/A;   FOOT SURGERY     KNEE SURGERY     WRIST SURGERY     Family History  Problem Relation Age of Onset   Heart disease Other    Cancer Sister        Uterine   Cancer Maternal Uncle    Cancer Paternal Aunt        Breast   Breast cancer Maternal Aunt    Social History   Socioeconomic History   Marital status: Married    Spouse name: Not on file   Number of children: Not on file   Years of education: Not on file   Highest education level: Not on file  Occupational History   Occupation: Retired   Occupation: Part-time  Tobacco Use   Smoking status: Never   Smokeless tobacco: Never  Vaping Use   Vaping Use: Never used  Substance and Sexual Activity   Alcohol use: Yes    Alcohol/week: 7.0 standard drinks of alcohol    Types: 7 Glasses of wine per week    Comment: glasses white wine daily   Drug use: No   Sexual activity: Not on file  Other Topics Concern   Not on file  Social History Narrative   Not on file   Social Determinants of Health   Financial Resource Strain: Low Risk  (04/26/2022)   Overall Financial Resource Strain (CARDIA)    Difficulty of Paying Living Expenses: Not hard at all  Food Insecurity: No Food Insecurity (04/26/2022)   Hunger Vital Sign    Worried About Running Out  of Food in the Last Year: Never true    Ran Out of Food in the Last Year: Never true  Transportation Needs: No Transportation Needs (04/26/2022)   PRAPARE - Hydrologist (Medical): No    Lack of Transportation (Non-Medical): No  Physical Activity: Inactive (04/26/2022)   Exercise Vital Sign    Days of Exercise per Week: 0 days  Minutes of Exercise per Session: 0 min  Stress: Stress Concern Present (04/26/2022)   [place]    Feeling of Stress : To some extent  Social Connections: Moderately Integrated (04/26/2022)   Social Connection and Isolation Panel [NHANES]    Frequency of Communication with Friends and Family: More than three times a week    Frequency of Social Gatherings with Friends and Family: Once a week    Attends Religious Services: Never    Marine scientist or Organizations: No    Attends Music therapist: More than 4 times per year    Marital Status: Married    Tobacco Counseling Counseling given: Not Answered   Clinical Intake:  Pre-visit preparation completed: No  Pain : No/denies pain Pain Score: 0-No pain     Nutritional Risks: None Diabetes: No  How often do you need to have someone help you when you read instructions, pamphlets, or other written materials from your doctor or pharmacy?: 1 - Never  Diabetic?No  Interpreter Needed?: No  Information entered by :: Donnie Mesa, CMA   Activities of Daily Living    04/26/2022    1:12 PM  In your present state of health, do you have any difficulty performing the following activities:  Hearing? 1  Vision? 1  Difficulty concentrating or making decisions? 1  Walking or climbing stairs? 0  Dressing or bathing? 0  Doing errands, shopping? 0    Patient Care Team: Erskine Emery, MD as PCP - General (Family Medicine)  Indicate any recent Medical Services you may have received from other than  [facility] providers in the past year (date may be approximate).     Assessment:   This is a routine wellness examination for [facility].  Hearing/Vision screen Admits to some hearing loss. She report having her hearing checked several years ago and was told then she had some mild hearing loss. Annual Eye Exam: [facility], Wear glasses.   Dietary issues and exercise activities discussed: Current Exercise Habits: The patient does not participate in regular exercise at present, Exercise limited by: None identified   Goals Addressed   None    Depression Screen    04/26/2022    1:10 PM 04/12/2022    4:16 PM 12/14/2020    2:56 PM 06/21/2020    3:54 PM 11/18/2019    8:59 AM 04/12/2019    1:33 PM 01/01/2018    2:04 PM  PHQ 2/9 Scores  PHQ - 2 Score 2 4 6 2 $ 0 2 0  PHQ- 9 Score 4 10 15 11 $ 0      Fall Risk    04/26/2022    1:12 PM 04/12/2022    4:16 PM 01/25/2014    3:49 PM 12/30/2013    4:08 PM  Fall Risk   Falls in the past year? 0 1 No No  Number falls in past yr: 0 0    Injury with Fall? 0     Risk for fall due to : No Fall Risks     Follow up Falls evaluation completed       [place]:  Any stairs in or around the home? Yes  If so, are there any without handrails? No  Home free of loose throw rugs in walkways, pet beds, electrical cords, etc? Yes  Adequate lighting in your home to reduce risk of falls? Yes   ASSISTIVE DEVICES UTILIZED TO PREVENT  FALLS:  Life alert? No  Use of a cane, walker or w/c? No  Grab bars in the bathroom? Yes  Shower chair or bench in shower? Yes  Elevated toilet seat or a handicapped toilet? No   TIMED UP AND GO:  Was the test performed?  Unable to perform, virtual appointment   Cognitive Function:        04/26/2022    1:13 PM  6CIT Screen  What Year? 0 points  What month? 0 points  What time? 0 points  Count back from 20 0 points  Months in reverse 0 points  Repeat phrase 0 points  Total Score 0  points    Immunizations Immunization History  Administered Date(s) Administered   Fluad Quad(high Dose 65+) 12/14/2020   Influenza, Quadrivalent, Recombinant, Inj, Pf 01/03/2019   Influenza,inj,Quad PF,6+ Mos 12/27/2013, 12/05/2016, 01/01/2018   Influenza-Unspecified 12/16/2012, 11/30/2016, 01/20/2020, 11/21/2021   PFIZER(Purple Top)SARS-COV-2 Vaccination 05/22/2019, 06/12/2019, 03/02/2020   Pfizer Covid-19 Vaccine Bivalent Booster 55yr & up 12/19/2021   Tdap 08/28/2016    TDAP status: Up to date  Flu Vaccine status: Up to date  Pneumococcal vaccine status: Due, Education has been provided regarding the importance of this vaccine. Advised may receive this vaccine at local pharmacy or Health Dept. Aware to provide a copy of the vaccination record if obtained from local pharmacy or Health Dept. Verbalized acceptance and understanding.  Covid-19 vaccine status: Information provided on how to obtain vaccines.   Qualifies for Shingles Vaccine? Yes   Zostavax completed No   Shingrix Completed?: No.    Education has been provided regarding the importance of this vaccine. Patient has been advised to call insurance company to determine out of pocket expense if they have not yet received this vaccine. Advised may also receive vaccine at local pharmacy or Health Dept. Verbalized acceptance and understanding.  Screening Tests Health Maintenance  Topic Date Due   Zoster Vaccines- Shingrix (1 of 2) Never done   Pneumonia Vaccine 67 Years old (1 of 1 - PCV) Never done   DEXA SCAN  Never done   COVID-19 Vaccine (5 - 2023-24 season) 02/13/2022   Medicare Annual Wellness (AWV)  04/27/2023   MAMMOGRAM  12/01/2023   DTaP/Tdap/Td (2 - Td or Tdap) 08/29/2026   COLONOSCOPY (Pts 45-459yrInsurance coverage will need to be confirmed)  08/06/2027   INFLUENZA VACCINE  Completed   Hepatitis C Screening  Completed   HPV VACCINES  Aged Out    Health Maintenance  Health Maintenance Due  Topic Date  Due   Zoster Vaccines- Shingrix (1 of 2) Never done   Pneumonia Vaccine 6519Years old (1 of 1 - PCV) Never done   DEXA SCAN  Never done   COVID-19 Vaccine (5 - 2023-24 season) 02/13/2022    Colorectal cancer screening: Type of screening: Colonoscopy. Completed 08/05/2017. Repeat every 10 years  Mammogram status: Completed 11/30/2021. Repeat every year  DEXA Scan: overdue  Lung Cancer Screening: (Low Dose CT Chest recommended if Age 67-80ears, 30 pack-year currently smoking OR have quit w/in 15years.) does not qualify.   Lung Cancer Screening Referral: not applicable   Additional Screening:  Hepatitis C Screening: does qualify; Completed 11/23/2011  Vision Screening: Recommended annual ophthalmology exams for early detection of glaucoma and other disorders of the eye. Is the patient up to date with their annual eye exam?  Yes  Who is the provider or what is the name of the office in which the patient attends annual eye  exams? [facility] If pt is not established with a provider, would they like to be referred to a provider to establish care? Yes .   Dental Screening: Recommended annual dental exams for proper oral hygiene  Community Resource Referral / Chronic Care Management: CRR required this visit?  No   CCM required this visit?  No      Plan:     I have personally reviewed and noted the following in the patient's chart:   Medical and social history Use of alcohol, tobacco or illicit drugs  Current medications and supplements including opioid prescriptions. Patient is not currently taking opioid prescriptions. Functional ability and status Nutritional status Physical activity Advanced directives List of other physicians Hospitalizations, surgeries, and ER visits in previous 12 months Vitals Screenings to include cognitive, depression, and falls Referrals and appointments  In addition, I have reviewed and discussed with patient certain preventive  protocols, quality metrics, and best practice recommendations. A written personalized care plan for preventive services as well as general preventive health recommendations were provided to patient.    Ms. Buckner Malta , Thank you for taking time to come for your Medicare Wellness Visit. I appreciate your ongoing commitment to your health goals. Please review the following plan we discussed and let me know if I can assist you in the future.   These are the goals we discussed:  Goals   None     This is a list of the screening recommended for you and due dates:  Health Maintenance  Topic Date Due   Zoster (Shingles) Vaccine (1 of 2) Never done   Pneumonia Vaccine (1 of 1 - PCV) Never done   DEXA scan (bone density measurement)  Never done   COVID-19 Vaccine (5 - 2023-24 season) 02/13/2022   Medicare Annual Wellness Visit  04/27/2023   Mammogram  12/01/2023   DTaP/Tdap/Td vaccine (2 - Td or Tdap) 08/29/2026   Colon Cancer Screening  08/06/2027   Flu Shot  Completed   Hepatitis C Screening: USPSTF Recommendation to screen - Ages 18-79 yo.  Completed   HPV Vaccine  Aged [address] [place]   04/26/2022   Nurse Notes: Approximately 30 minute Non-Face -To-Face Medicare Wellness Visit

## 2022-04-26 NOTE — Patient Instructions (Signed)

## 2022-05-11 ENCOUNTER — Other Ambulatory Visit: Payer: Self-pay | Admitting: Student

## 2022-05-11 DIAGNOSIS — M65311 Trigger thumb, right thumb: Secondary | ICD-10-CM

## 2022-05-15 DIAGNOSIS — F1090 Alcohol use, unspecified, uncomplicated: Secondary | ICD-10-CM | POA: Diagnosis not present

## 2022-05-15 DIAGNOSIS — F330 Major depressive disorder, recurrent, mild: Secondary | ICD-10-CM | POA: Diagnosis not present

## 2022-05-16 ENCOUNTER — Other Ambulatory Visit: Payer: Self-pay | Admitting: Family Medicine

## 2022-05-16 DIAGNOSIS — E2839 Other primary ovarian failure: Secondary | ICD-10-CM

## 2022-05-17 DIAGNOSIS — H903 Sensorineural hearing loss, bilateral: Secondary | ICD-10-CM | POA: Diagnosis not present

## 2022-05-26 ENCOUNTER — Other Ambulatory Visit: Payer: Self-pay | Admitting: Student

## 2022-05-27 ENCOUNTER — Encounter: Payer: Self-pay | Admitting: Student

## 2022-06-12 DIAGNOSIS — F411 Generalized anxiety disorder: Secondary | ICD-10-CM | POA: Diagnosis not present

## 2022-06-12 DIAGNOSIS — F331 Major depressive disorder, recurrent, moderate: Secondary | ICD-10-CM | POA: Diagnosis not present

## 2022-06-17 DIAGNOSIS — F411 Generalized anxiety disorder: Secondary | ICD-10-CM | POA: Diagnosis not present

## 2022-06-17 DIAGNOSIS — F330 Major depressive disorder, recurrent, mild: Secondary | ICD-10-CM | POA: Diagnosis not present

## 2022-07-03 DIAGNOSIS — F330 Major depressive disorder, recurrent, mild: Secondary | ICD-10-CM | POA: Diagnosis not present

## 2022-07-24 DIAGNOSIS — F411 Generalized anxiety disorder: Secondary | ICD-10-CM | POA: Diagnosis not present

## 2022-07-24 DIAGNOSIS — F330 Major depressive disorder, recurrent, mild: Secondary | ICD-10-CM | POA: Diagnosis not present

## 2022-08-14 DIAGNOSIS — F411 Generalized anxiety disorder: Secondary | ICD-10-CM | POA: Diagnosis not present

## 2022-08-14 DIAGNOSIS — F3341 Major depressive disorder, recurrent, in partial remission: Secondary | ICD-10-CM | POA: Diagnosis not present

## 2022-09-02 DIAGNOSIS — F331 Major depressive disorder, recurrent, moderate: Secondary | ICD-10-CM | POA: Diagnosis not present

## 2022-09-10 DIAGNOSIS — Z08 Encounter for follow-up examination after completed treatment for malignant neoplasm: Secondary | ICD-10-CM | POA: Diagnosis not present

## 2022-09-10 DIAGNOSIS — Z86007 Personal history of in-situ neoplasm of skin: Secondary | ICD-10-CM | POA: Diagnosis not present

## 2022-09-10 DIAGNOSIS — Z85828 Personal history of other malignant neoplasm of skin: Secondary | ICD-10-CM | POA: Diagnosis not present

## 2022-09-10 DIAGNOSIS — L821 Other seborrheic keratosis: Secondary | ICD-10-CM | POA: Diagnosis not present

## 2022-09-10 DIAGNOSIS — D225 Melanocytic nevi of trunk: Secondary | ICD-10-CM | POA: Diagnosis not present

## 2022-09-10 DIAGNOSIS — L814 Other melanin hyperpigmentation: Secondary | ICD-10-CM | POA: Diagnosis not present

## 2022-09-23 DIAGNOSIS — F411 Generalized anxiety disorder: Secondary | ICD-10-CM | POA: Diagnosis not present

## 2022-09-23 DIAGNOSIS — F3341 Major depressive disorder, recurrent, in partial remission: Secondary | ICD-10-CM | POA: Diagnosis not present

## 2022-09-23 DIAGNOSIS — F1090 Alcohol use, unspecified, uncomplicated: Secondary | ICD-10-CM | POA: Diagnosis not present

## 2022-09-26 DIAGNOSIS — S8254XA Nondisplaced fracture of medial malleolus of right tibia, initial encounter for closed fracture: Secondary | ICD-10-CM | POA: Diagnosis not present

## 2022-09-26 DIAGNOSIS — S82831A Other fracture of upper and lower end of right fibula, initial encounter for closed fracture: Secondary | ICD-10-CM | POA: Diagnosis not present

## 2022-09-26 DIAGNOSIS — S82434A Nondisplaced oblique fracture of shaft of right fibula, initial encounter for closed fracture: Secondary | ICD-10-CM | POA: Diagnosis not present

## 2022-09-26 DIAGNOSIS — M25571 Pain in right ankle and joints of right foot: Secondary | ICD-10-CM | POA: Diagnosis not present

## 2022-09-26 DIAGNOSIS — S99911A Unspecified injury of right ankle, initial encounter: Secondary | ICD-10-CM | POA: Diagnosis not present

## 2022-09-26 DIAGNOSIS — S8251XA Displaced fracture of medial malleolus of right tibia, initial encounter for closed fracture: Secondary | ICD-10-CM | POA: Diagnosis not present

## 2022-09-27 ENCOUNTER — Encounter (HOSPITAL_BASED_OUTPATIENT_CLINIC_OR_DEPARTMENT_OTHER): Payer: Self-pay | Admitting: Physician Assistant

## 2022-09-27 ENCOUNTER — Other Ambulatory Visit (HOSPITAL_BASED_OUTPATIENT_CLINIC_OR_DEPARTMENT_OTHER): Payer: Self-pay

## 2022-09-27 ENCOUNTER — Ambulatory Visit (INDEPENDENT_AMBULATORY_CARE_PROVIDER_SITE_OTHER): Payer: Medicare PPO | Admitting: Physician Assistant

## 2022-09-27 ENCOUNTER — Ambulatory Visit (HOSPITAL_BASED_OUTPATIENT_CLINIC_OR_DEPARTMENT_OTHER): Payer: Medicare PPO

## 2022-09-27 DIAGNOSIS — M25571 Pain in right ankle and joints of right foot: Secondary | ICD-10-CM

## 2022-09-27 DIAGNOSIS — S8261XA Displaced fracture of lateral malleolus of right fibula, initial encounter for closed fracture: Secondary | ICD-10-CM | POA: Diagnosis not present

## 2022-09-27 DIAGNOSIS — S82891A Other fracture of right lower leg, initial encounter for closed fracture: Secondary | ICD-10-CM | POA: Diagnosis not present

## 2022-09-27 DIAGNOSIS — S82851A Displaced trimalleolar fracture of right lower leg, initial encounter for closed fracture: Secondary | ICD-10-CM | POA: Diagnosis not present

## 2022-09-27 DIAGNOSIS — S8251XA Displaced fracture of medial malleolus of right tibia, initial encounter for closed fracture: Secondary | ICD-10-CM | POA: Diagnosis not present

## 2022-09-27 MED ORDER — HYDROCODONE-ACETAMINOPHEN 5-325 MG PO TABS
1.0000 | ORAL_TABLET | ORAL | 0 refills | Status: DC | PRN
  Filled 2022-09-27: qty 30, 5d supply, fill #0

## 2022-09-27 MED ORDER — ONDANSETRON HCL 4 MG PO TABS
4.0000 mg | ORAL_TABLET | Freq: Three times a day (TID) | ORAL | 0 refills | Status: DC | PRN
  Filled 2022-09-27: qty 20, 7d supply, fill #0

## 2022-09-27 NOTE — Progress Notes (Addendum)
Office Visit Note   Patient: Sheryl Howell           Date of Birth: [birth year]           MRN: [id number] Visit Date: 09/27/2022              Requested by: Alfredo Martinez, MD [address] [place],  [place] [address] PCP: Alfredo Martinez, MD   Assessment & Plan: Visit Diagnoses:  1. Pain in right ankle and joints of right foot   2. Closed fracture of right ankle, initial encounter     Plan: Patient is a pleasant 67 year old woman who is up at her vacation cabin with her husband near [place] and fell onto the outside of her ankle.  She had immediate pain and swelling.  She was seen by a local care clinic and x-rays were done.  They demonstrated a bimalleolar displaced fracture of the right ankle.  She had no dislocation.  No previous history of ankle issues.  She is a retired Runner, broadcasting/film/video .  She is not diabetic she is not a smoker denies any history of blood clots.  Repeat x-rays were done today which demonstrated again a bimalleolar fracture of the right ankle.  I discussed her case with Dr. Lajoyce Corners who is on practice call today.  He asked that we place a splint and stirrup on her today with instructions to ice and elevate.  She will follow-up with him on Monday for surgical planning.  I did examine her skin today and she has some ecchymosis but no blistering.  Compartments are soft and compressible she has a nice strong pulse.  She has a history of previous fractures in her hand and wrist and is scheduled for a bone density test in August.  I did provide her some pain medicine and some Zofran as she does have some nausea with medications pulse was rechecked after splint was applied still had strong dorsalis pedis  Follow-Up Instructions: On Monday with Dr. Lajoyce Corners  Orders:  Orders Placed This Encounter  Procedures   DG Ankle Complete Right   Meds ordered this encounter  Medications   HYDROcodone-acetaminophen (NORCO/VICODIN) 5-325 MG tablet    Sig: Take 1 tablet by mouth every 4 (four)  hours as needed for moderate pain.    Dispense:  60 tablet    Refill:  0   ondansetron (ZOFRAN) 4 MG tablet    Sig: Take 1 tablet (4 mg total) by mouth every 8 (eight) hours as needed for nausea or vomiting.    Dispense:  20 tablet    Refill:  0      Procedures: No procedures performed   Clinical Data: No additional findings.   Subjective: Chief Complaint  Patient presents with   Right Ankle - Pain    HPI patient is a pleasant 67 year old woman who was vacationing at her home near [place].  She fell down some stairs onto concrete on the side of her ankle.  Rated her pain as severe with associated swelling was seen and evaluated there by a local emergency facility.  Was told she had an ankle fracture and was placed in a splint she said she had some numbness and tingling overnight.  She is otherwise fairly healthy  Review of Systems  All other systems reviewed and are negative.    Objective: Vital Signs: There were no vitals taken for this visit.  Physical Exam Constitutional:      Appearance: Normal appearance.  Pulmonary:  Effort: Pulmonary effort is normal.  Skin:    General: Skin is warm and dry.  Neurological:     General: No focal deficit present.     Mental Status: She is alert.  Psychiatric:        Mood and Affect: Mood normal.     Ortho Exam Examination of her right ankle her foot is warm with brisk capillary refill she has moderate soft tissue swelling and early ecchymosis.  No blistering on either side of her ankle.  She has a somewhat strong dorsalis pedis pulse.  Sensation is intact compartments are soft and compressible acutely tender over the lateral medial malleoli no tenderness over the proximal fibula Specialty Comments:  No specialty comments available.  Imaging: No results found.   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   Hx of seizure disorder 04/09/2012   Anemia 04/09/2012   Symptomatic cholelithiasis 04/07/2012   Acute hepatitis B 11/29/2011   Overweight(278.02) 05/27/2011   INSOMNIA 10/11/2009   Inguinal pain, left 10/11/2009   Anxiety and depression 09/14/2009   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   Past Medical History:  Diagnosis Date   Adverse effect of anesthetic    bp low-takes awhile to come up post op   Anemia    Anemia 04/09/2012   Hx of anemia   Anxiety    Cholelithiasis and acute cholecystitis with obstruction 04/09/2012   Depression    Hepatitis B    9/13-dx hep b, HB S ab + 04/07/2012   Hx of anxiety disorder 04/09/2012   Hx of seizure disorder 04/09/2012   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   Hypotension    Nausea    PONV (postoperative nausea and vomiting)    Seizures ([facility])     Family History  Problem Relation Age of Onset   Heart disease Other    Cancer Sister        Uterine   Cancer Maternal Uncle    Cancer Paternal Aunt        Breast   Breast cancer Maternal Aunt     Past Surgical History:  Procedure Laterality Date   BUNIONECTOMY     both feet   CHOLECYSTECTOMY  04/08/2012   Procedure: LAPAROSCOPIC CHOLECYSTECTOMY WITH INTRAOPERATIVE CHOLANGIOGRAM;  Surgeon: Emelia Loron, MD;  Location: [facility];  Service: General;  Laterality: Right;  C-  Arm   COLONOSCOPY     DIAGNOSTIC LAPAROSCOPY  1992   exp lap fertility   ERCP  04/09/2012   Procedure: ENDOSCOPIC RETROGRADE CHOLANGIOPANCREATOGRAPHY (ERCP);  Surgeon: Iva Boop, MD;  Location: [facility];  Service: Endoscopy;  Laterality: N/A;    FOOT SURGERY     KNEE SURGERY     WRIST SURGERY     Social History   Occupational History   Occupation: Retired   Occupation: Part-time  Tobacco Use   Smoking status: Never   Smokeless tobacco: Never  Vaping Use   Vaping status: Never Used  Substance and Sexual Activity  Alcohol use: Yes    Alcohol/week: 7.0 standard drinks of alcohol    Types: 7 Glasses of wine per week    Comment: glasses white wine daily   Drug use: No   Sexual activity: Not on file

## 2022-09-30 ENCOUNTER — Ambulatory Visit: Payer: Medicare PPO | Admitting: Orthopedic Surgery

## 2022-09-30 ENCOUNTER — Other Ambulatory Visit: Payer: Self-pay

## 2022-09-30 ENCOUNTER — Encounter: Payer: Self-pay | Admitting: Orthopedic Surgery

## 2022-09-30 ENCOUNTER — Encounter (HOSPITAL_COMMUNITY): Payer: Self-pay | Admitting: Orthopedic Surgery

## 2022-09-30 DIAGNOSIS — S82891A Other fracture of right lower leg, initial encounter for closed fracture: Secondary | ICD-10-CM

## 2022-09-30 NOTE — Progress Notes (Signed)
Office Visit Note   Patient: Sheryl Howell           Date of Birth: May 11, 1955           MRN: [id number] Visit Date: 09/30/2022              Requested by: Alfredo Martinez, MD [address] [place],  [place] [address] PCP: Alfredo Martinez, MD  Chief Complaint  Patient presents with   Right Ankle - Pain      HPI: Patient is a 67 year old woman who was seen for initial evaluation for a Weber B bimalleolar right ankle fracture.  Patient sustained a supination external rotation injury she was placed in a sugar-tong splint and is seen today for initial evaluation.  Initial injury July 11.  Assessment & Plan: Visit Diagnoses:  1. Closed fracture of right ankle, initial encounter     Plan: Plan for open reduction internal fixation right ankle fracture.  Risk and benefits were discussed including infection neurovascular injury pain DVT need for additional surgery.  Patient states she understands wished to proceed at this time she will use an aspirin postoperatively for DVT prophylaxis.  Patient will use a kneeling scooter postoperatively.  Follow-Up Instructions: Return in about 2 weeks (around 10/14/2022).   Ortho Exam  Patient is alert, oriented, no adenopathy, well-dressed, normal affect, normal respiratory effort. Examination patient is a good dorsalis pedis pulse she is developing equinus contracture there is swelling but no blisters.  She has some irritation of the skin medially from the sugar-tong splint we will place her in a cam boot walker.  Review of the radiographs shows a displaced bimalleolar ankle fracture with shortening of the fibula and displaced medial malleolus.  There is no posterior malleolar fragment.  Imaging: No results found. No images are attached to the encounter.  Labs: Lab Results  Component Value Date   HGBA1C 5.2 05/15/2017   REPTSTATUS 11/27/2015 FINAL 11/24/2015   CULT NO GROUP A STREP (S.PYOGENES) ISOLATED 11/24/2015     Lab Results   Component Value Date   ALBUMIN 4.6 05/15/2017   ALBUMIN 2.7 (L) 04/10/2012   ALBUMIN 2.8 (L) 04/09/2012    No results found for: "MG" Lab Results  Component Value Date   VD25OH 24.5 (L) 05/15/2017   VD25OH 26 (L) 12/14/2015    No results found for: "PREALBUMIN"    Latest Ref Rng & Units 05/15/2017    3:51 PM 12/14/2015    4:35 PM 06/09/2014   12:00 AM  CBC EXTENDED  WBC 3.4 - 10.8 x10E3/uL 5.1  6.6  5.6   RBC 3.77 - 5.28 x10E6/uL 4.74  4.92    Hemoglobin 11.1 - 15.9 g/dL 08.6  57.8  46.9   HCT 34.0 - 46.6 % 43.8  43.7  44   Platelets 150 - 379 x10E3/uL 257  266  272   NEUT# 1.4 - 7.0 x10E3/uL 2.6     Lymph# 0.7 - 3.1 x10E3/uL 1.8        There is no height or weight on file to calculate BMI.  Orders:  No orders of the defined types were placed in this encounter.  No orders of the defined types were placed in this encounter.    Procedures: No procedures performed  Clinical Data: No additional findings.  ROS:  All other systems negative, except as noted in the HPI. Review of Systems  Objective: Vital Signs: There were no vitals taken for this visit.  Specialty Comments:  No specialty comments  available.  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   Hx of seizure disorder 04/09/2012   Anemia 04/09/2012   Symptomatic cholelithiasis 04/07/2012   Acute hepatitis B 11/29/2011   Overweight(278.02) 05/27/2011   INSOMNIA 10/11/2009   Inguinal pain, left 10/11/2009    Anxiety and depression 09/14/2009   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   Past Medical History:  Diagnosis Date   Adverse effect of anesthetic    bp low-takes awhile to come up post op   Anemia    Anemia 04/09/2012   Hx of anemia   Anxiety    Cholelithiasis and acute cholecystitis with obstruction 04/09/2012   Depression    Hepatitis B    9/13-dx hep b, HB S ab + 04/07/2012   Hx of anxiety disorder 04/09/2012   Hx of seizure disorder 04/09/2012   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   Hypotension    Nausea    PONV (postoperative nausea and vomiting)    Seizures (HCC)     Family History  Problem Relation Age of Onset   Heart disease Other    Cancer Sister        Uterine   Cancer Maternal Uncle    Cancer Paternal Aunt        Breast   Breast cancer Maternal Aunt     Past Surgical History:  Procedure Laterality Date   BUNIONECTOMY     both feet   CHOLECYSTECTOMY  04/08/2012   Procedure: LAPAROSCOPIC CHOLECYSTECTOMY WITH INTRAOPERATIVE CHOLANGIOGRAM;  Surgeon: Emelia Loron, MD;  Location: [facility];  Service: General;  Laterality: Right;  C-  Arm   COLONOSCOPY     DIAGNOSTIC LAPAROSCOPY  1992   exp lap fertility   ERCP  04/09/2012   Procedure: ENDOSCOPIC RETROGRADE CHOLANGIOPANCREATOGRAPHY (ERCP);  Surgeon: Iva Boop, MD;  Location: [facility];  Service: Endoscopy;  Laterality: N/A;   FOOT SURGERY     KNEE SURGERY     WRIST SURGERY     Social History   Occupational History   Occupation: Retired   Occupation: Part-time  Tobacco Use   Smoking status: Never   Smokeless tobacco: Never  Vaping Use   Vaping status: Never Used  Substance and Sexual Activity   Alcohol use: Yes    Alcohol/week: 7.0 standard drinks of alcohol    Types: 7 Glasses of wine per week    Comment: glasses white wine daily   Drug use: No   Sexual activity: Not on file

## 2022-09-30 NOTE — Progress Notes (Signed)
SDW CALL  Patient was given pre-op instructions over the phone. The opportunity was given for the patient to ask questions. No further questions asked. Patient verbalized understanding of instructions given.   PCP - [facility] Cardiologist - denies  PPM/ICD - denies Device Orders -  Rep Notified -   Chest x-ray - na EKG - na Stress Test - none ECHO - none Cardiac Cath - none  Sleep Study - 01/15/16 CPAP - no  Fasting Blood Sugar - na Checks Blood Sugar _____ times a day  Blood Thinner Instructions:na Aspirin Instructions:na  ERAS Protcol -clears until 0755 PRE-SURGERY Ensure or G2- no  COVID TEST- no   Anesthesia review: no  Patient denies shortness of breath, fever, cough and chest pain over the phone call    Surgical Instructions    Your procedure is scheduled on Wednesday July 17.  Report to [facility] Main Entrance "A" at 0825 A.M., then check in with the Admitting office.  Call this number if you have problems the morning of surgery:  [phone]    Remember:  Do not eat after midnight the night before your surgery  You may drink clear liquids until 0755 am the morning of your surgery.   Clear liquids allowed are: Water, Non-Citrus Juices (without pulp), Carbonated Beverages, Clear Tea, Black Coffee ONLY (NO MILK, CREAM OR POWDERED CREAMER of any kind), and Gatorade   Take these medicines the morning of surgery with A SIP OF WATER:  Wellbutrin XL,Buspar.  PRN - Norco, Zofran   As of today, STOP taking any Aspirin (unless otherwise instructed by your surgeon) Aleve, Naproxen, Ibuprofen, Motrin, Advil, Goody's, BC's, all herbal medications, fish oil, and all vitamins.  [facility] is not responsible for any belongings or valuables. .   Do NOT Smoke (Tobacco/Vaping)  24 hours prior to your procedure  If you use a CPAP at night, you may bring your mask for your overnight stay.   Contacts, glasses, hearing aids, dentures or  partials may not be worn into surgery, please bring cases for these belongings   Patients discharged the day of surgery will not be allowed to drive home, and someone needs to stay with them for 24 hours.   SURGICAL WAITING ROOM VISITATION You may have 1 visitor in the pre-op area at a time determined by the pre-op nurse. (Visitor may not switch out) Patients having surgery or a procedure in a hospital may have two support people in the waiting room. Children under the age of 40 must have an adult with them who is not the patient. They may stay in the waiting area during the procedure and may switch out with other visitors. If the patient needs to stay at the hospital during part of their recovery, the visitor guidelines for inpatient rooms apply.  Please refer to the [facility] website for the visitor guidelines for Inpatients (after your surgery is over and you are in a regular room).     Special instructions:    Oral Hygiene is also important to reduce your risk of infection.  Remember - BRUSH YOUR TEETH THE MORNING OF SURGERY WITH YOUR REGULAR TOOTHPASTE   Day of Surgery:  Take a shower the day of or night before with antibacterial soap. Wear Clean/Comfortable clothing the morning of surgery Do not apply any deodorants/lotions.   Do not wear jewelry or makeup Do not wear lotions, powders, perfumes/colognes, or deodorant. Do not shave 48 hours prior to surgery.  Men may  shave face and neck. Do not bring valuables to the hospital. Do not wear nail polish, gel polish, artificial nails, or any other type of covering on natural nails (fingers and toes) If you have artificial nails or gel coating that need to be removed by a nail salon, please have this removed prior to surgery. Artificial nails or gel coating may interfere with anesthesia's ability to adequately monitor your vital signs. Remember to brush your teeth WITH YOUR REGULAR TOOTHPASTE.

## 2022-10-02 ENCOUNTER — Encounter (HOSPITAL_COMMUNITY)
Admission: RE | Disposition: A | Discharge status: Home / Self Care | Payer: Self-pay | Source: Home / Self Care | Attending: Orthopedic Surgery

## 2022-10-02 ENCOUNTER — Other Ambulatory Visit: Payer: Self-pay

## 2022-10-02 ENCOUNTER — Ambulatory Visit (HOSPITAL_COMMUNITY): Payer: Medicare PPO

## 2022-10-02 ENCOUNTER — Ambulatory Visit (HOSPITAL_COMMUNITY)
Admission: RE | Admit: 2022-10-02 | Discharge: 2022-10-02 | Disposition: A | Discharge status: Home / Self Care | Payer: Medicare PPO | Attending: Orthopedic Surgery | Admitting: Orthopedic Surgery

## 2022-10-02 ENCOUNTER — Ambulatory Visit (HOSPITAL_BASED_OUTPATIENT_CLINIC_OR_DEPARTMENT_OTHER): Payer: Medicare PPO | Admitting: Certified Registered"

## 2022-10-02 ENCOUNTER — Encounter (HOSPITAL_COMMUNITY): Payer: Self-pay | Admitting: Orthopedic Surgery

## 2022-10-02 ENCOUNTER — Ambulatory Visit (HOSPITAL_COMMUNITY): Payer: Self-pay | Admitting: Certified Registered"

## 2022-10-02 DIAGNOSIS — D649 Anemia, unspecified: Secondary | ICD-10-CM

## 2022-10-02 DIAGNOSIS — Z85828 Personal history of other malignant neoplasm of skin: Secondary | ICD-10-CM | POA: Diagnosis not present

## 2022-10-02 DIAGNOSIS — S8291XA Unspecified fracture of right lower leg, initial encounter for closed fracture: Secondary | ICD-10-CM | POA: Diagnosis not present

## 2022-10-02 DIAGNOSIS — F418 Other specified anxiety disorders: Secondary | ICD-10-CM

## 2022-10-02 DIAGNOSIS — Z6830 Body mass index (BMI) 30.0-30.9, adult: Secondary | ICD-10-CM | POA: Diagnosis not present

## 2022-10-02 DIAGNOSIS — X501XXA Overexertion from prolonged static or awkward postures, initial encounter: Secondary | ICD-10-CM | POA: Insufficient documentation

## 2022-10-02 DIAGNOSIS — E785 Hyperlipidemia, unspecified: Secondary | ICD-10-CM | POA: Diagnosis not present

## 2022-10-02 DIAGNOSIS — S82891A Other fracture of right lower leg, initial encounter for closed fracture: Secondary | ICD-10-CM | POA: Diagnosis not present

## 2022-10-02 DIAGNOSIS — E669 Obesity, unspecified: Secondary | ICD-10-CM | POA: Diagnosis not present

## 2022-10-02 DIAGNOSIS — S82841A Displaced bimalleolar fracture of right lower leg, initial encounter for closed fracture: Secondary | ICD-10-CM | POA: Diagnosis not present

## 2022-10-02 HISTORY — DX: Unspecified osteoarthritis, unspecified site: M19.90

## 2022-10-02 HISTORY — PX: ORIF ANKLE FRACTURE: SHX5408

## 2022-10-02 HISTORY — DX: Malignant (primary) neoplasm, unspecified: C80.1

## 2022-10-02 LAB — COMPREHENSIVE METABOLIC PANEL
ALT: 20 U/L (ref 0–44)
AST: 21 U/L (ref 15–41)
Albumin: 3.1 g/dL — ABNORMAL LOW (ref 3.5–5.0)
Alkaline Phosphatase: 95 U/L (ref 38–126)
Anion gap: 9 (ref 5–15)
BUN: 11 mg/dL (ref 8–23)
CO2: 23 mmol/L (ref 22–32)
Calcium: 8.5 mg/dL — ABNORMAL LOW (ref 8.9–10.3)
Chloride: 104 mmol/L (ref 98–111)
Creatinine, Ser: 0.92 mg/dL (ref 0.44–1.00)
GFR, Estimated: >60 mL/min (ref >60)
Glucose, Bld: 109 mg/dL — ABNORMAL HIGH (ref 70–99)
Potassium: 3.4 mmol/L — ABNORMAL LOW (ref 3.5–5.1)
Sodium: 136 mmol/L (ref 135–145)
Total Bilirubin: 0.7 mg/dL (ref 0.3–1.2)
Total Protein: 5.8 g/dL — ABNORMAL LOW (ref 6.5–8.1)

## 2022-10-02 LAB — CBC
HCT: 35.3 % — ABNORMAL LOW (ref 36.0–46.0)
Hemoglobin: 11 g/dL — ABNORMAL LOW (ref 12.0–15.0)
MCH: 25.6 pg — ABNORMAL LOW (ref 26.0–34.0)
MCHC: 31.2 g/dL (ref 30.0–36.0)
MCV: 82.3 fL (ref 80.0–100.0)
Platelets: 289 10*3/uL (ref 150–400)
RBC: 4.29 MIL/uL (ref 3.87–5.11)
RDW: 15.9 % — ABNORMAL HIGH (ref 11.5–15.5)
WBC: 7.1 10*3/uL (ref 4.0–10.5)
nRBC: 0 % (ref 0.0–0.2)

## 2022-10-02 LAB — SURGICAL PCR SCREEN
MRSA, PCR: POSITIVE — AB
Staphylococcus aureus: POSITIVE — AB

## 2022-10-02 SURGERY — OPEN REDUCTION INTERNAL FIXATION (ORIF) ANKLE FRACTURE
Anesthesia: Monitor Anesthesia Care | Site: Ankle | Laterality: Right

## 2022-10-02 MED ORDER — HYDROMORPHONE HCL 1 MG/ML IJ SOLN: 0.2500 mg | INTRAMUSCULAR | Status: DC | PRN

## 2022-10-02 MED ORDER — ONDANSETRON HCL 4 MG/2ML IJ SOLN
INTRAMUSCULAR | Status: DC | PRN
  Administered 2022-10-02: 4 mg via INTRAVENOUS

## 2022-10-02 MED ORDER — DEXAMETHASONE SODIUM PHOSPHATE 10 MG/ML IJ SOLN
INTRAMUSCULAR | Status: DC | PRN
  Administered 2022-10-02: 10 mg via INTRAVENOUS

## 2022-10-02 MED ORDER — LACTATED RINGERS IV SOLN: INTRAVENOUS | Status: DC

## 2022-10-02 MED ORDER — PHENYLEPHRINE HCL-NACL 20-0.9 MG/250ML-% IV SOLN
INTRAVENOUS | Status: DC | PRN
  Administered 2022-10-02: 25 ug/min via INTRAVENOUS

## 2022-10-02 MED ORDER — OXYCODONE HCL 5 MG PO TABS: 5.0000 mg | ORAL_TABLET | Freq: Once | ORAL | Status: DC | PRN

## 2022-10-02 MED ORDER — PROPOFOL 500 MG/50ML IV EMUL
INTRAVENOUS | Status: DC | PRN
  Administered 2022-10-02: 150 ug/kg/min via INTRAVENOUS

## 2022-10-02 MED ORDER — OXYCODONE HCL 5 MG/5ML PO SOLN: 5.0000 mg | Freq: Once | ORAL | Status: DC | PRN

## 2022-10-02 MED ORDER — DEXMEDETOMIDINE HCL IN NACL 80 MCG/20ML IV SOLN
INTRAVENOUS | Status: DC | PRN
  Administered 2022-10-02: 8 ug via INTRAVENOUS

## 2022-10-02 MED ORDER — AMISULPRIDE (ANTIEMETIC) 5 MG/2ML IV SOLN: 10.0000 mg | Freq: Once | INTRAVENOUS | Status: DC | PRN

## 2022-10-02 MED ORDER — BUPIVACAINE HCL (PF) 0.5 % IJ SOLN
INTRAMUSCULAR | Status: DC | PRN
  Administered 2022-10-02: 20 mL via PERINEURAL
  Administered 2022-10-02: 15 mL via PERINEURAL

## 2022-10-02 MED ORDER — MIDAZOLAM HCL 2 MG/2ML IJ SOLN: 1.0000 mg | Freq: Once | INTRAMUSCULAR | Status: AC

## 2022-10-02 MED ORDER — CHLORHEXIDINE GLUCONATE 0.12 % MT SOLN
15.0000 mL | Freq: Once | OROMUCOSAL | Status: AC
  Administered 2022-10-02: 15 mL via OROMUCOSAL
  Filled 2022-10-02: qty 15

## 2022-10-02 MED ORDER — HYDROCODONE-ACETAMINOPHEN 5-325 MG PO TABS: 1.0000 | ORAL_TABLET | ORAL | 0 refills | Status: DC | PRN

## 2022-10-02 MED ORDER — MIDAZOLAM HCL 2 MG/2ML IJ SOLN
INTRAMUSCULAR | Status: AC
  Administered 2022-10-02: 1 mg via INTRAVENOUS
  Filled 2022-10-02: qty 2

## 2022-10-02 MED ORDER — FENTANYL CITRATE (PF) 250 MCG/5ML IJ SOLN
INTRAMUSCULAR | Status: AC
  Filled 2022-10-02: qty 5

## 2022-10-02 MED ORDER — ORAL CARE MOUTH RINSE: 15.0000 mL | Freq: Once | OROMUCOSAL | Status: AC

## 2022-10-02 MED ORDER — 0.9 % SODIUM CHLORIDE (POUR BTL) OPTIME
TOPICAL | Status: DC | PRN
  Administered 2022-10-02: 1000 mL

## 2022-10-02 MED ORDER — FENTANYL CITRATE (PF) 100 MCG/2ML IJ SOLN: 50.0000 ug | Freq: Once | INTRAMUSCULAR | Status: AC

## 2022-10-02 MED ORDER — FENTANYL CITRATE (PF) 100 MCG/2ML IJ SOLN
INTRAMUSCULAR | Status: AC
  Administered 2022-10-02: 50 ug via INTRAVENOUS
  Filled 2022-10-02: qty 2

## 2022-10-02 MED ORDER — EPHEDRINE SULFATE-NACL 50-0.9 MG/10ML-% IV SOSY
PREFILLED_SYRINGE | INTRAVENOUS | Status: DC | PRN
  Administered 2022-10-02: 5 mg via INTRAVENOUS

## 2022-10-02 MED ORDER — BUPIVACAINE LIPOSOME 1.3 % IJ SUSP
INTRAMUSCULAR | Status: DC | PRN
  Administered 2022-10-02: 10 mL via PERINEURAL

## 2022-10-02 MED ORDER — PHENYLEPHRINE 80 MCG/ML (10ML) SYRINGE FOR IV PUSH (FOR BLOOD PRESSURE SUPPORT)
PREFILLED_SYRINGE | INTRAVENOUS | Status: DC | PRN
  Administered 2022-10-02 (×5): 80 ug via INTRAVENOUS

## 2022-10-02 MED ORDER — CEFAZOLIN SODIUM-DEXTROSE 2-4 GM/100ML-% IV SOLN
2.0000 g | INTRAVENOUS | Status: AC
  Administered 2022-10-02: 2 g via INTRAVENOUS
  Filled 2022-10-02: qty 100

## 2022-10-02 MED ORDER — ONDANSETRON HCL 4 MG/2ML IJ SOLN: 4.0000 mg | Freq: Once | INTRAMUSCULAR | Status: DC | PRN

## 2022-10-02 SURGICAL SUPPLY — 51 items
BAG COUNTER SPONGE SURGICOUNT (BAG) ×1 IMPLANT
BAG SPNG CNTER NS LX DISP (BAG) ×1
BANDAGE ESMARK 6X9 LF (GAUZE/BANDAGES/DRESSINGS) IMPLANT
BIT DRILL 110X2.5XQCK CNCT (BIT) IMPLANT
BIT DRILL 2.5 (BIT) ×1
BIT DRL 110X2.5XQCK CNCT (BIT) ×1
BNDG CMPR 5X4 CHSV STRCH STRL (GAUZE/BANDAGES/DRESSINGS) ×1
BNDG CMPR 9X6 STRL LF SNTH (GAUZE/BANDAGES/DRESSINGS)
BNDG COHESIVE 4X5 TAN STRL (GAUZE/BANDAGES/DRESSINGS) ×1 IMPLANT
BNDG COHESIVE 4X5 TAN STRL LF (GAUZE/BANDAGES/DRESSINGS) IMPLANT
BNDG ESMARK 6X9 LF (GAUZE/BANDAGES/DRESSINGS)
BNDG GAUZE DERMACEA FLUFF 4 (GAUZE/BANDAGES/DRESSINGS) ×1 IMPLANT
BNDG GZE DERMACEA 4 6PLY (GAUZE/BANDAGES/DRESSINGS) ×1
COVER SURGICAL LIGHT HANDLE (MISCELLANEOUS) ×1 IMPLANT
DRAPE OEC MINIVIEW 54X84 (DRAPES) IMPLANT
DRAPE U-SHAPE 47X51 STRL (DRAPES) ×1 IMPLANT
DRSG ADAPTIC 3X8 NADH LF (GAUZE/BANDAGES/DRESSINGS) ×1 IMPLANT
DRSG EMULSION OIL 3X3 NADH (GAUZE/BANDAGES/DRESSINGS) IMPLANT
DURAPREP 26ML APPLICATOR (WOUND CARE) ×1 IMPLANT
ELECT REM PT RETURN 9FT ADLT (ELECTROSURGICAL) ×1
ELECTRODE REM PT RTRN 9FT ADLT (ELECTROSURGICAL) ×1 IMPLANT
GAUZE PAD ABD 8X10 STRL (GAUZE/BANDAGES/DRESSINGS) ×1 IMPLANT
GAUZE SPONGE 4X4 12PLY STRL (GAUZE/BANDAGES/DRESSINGS) ×1 IMPLANT
GLOVE BIOGEL PI IND STRL 9 (GLOVE) ×1 IMPLANT
GLOVE SURG ORTHO 9.0 STRL STRW (GLOVE) ×1 IMPLANT
GOWN STRL REUS W/ TWL XL LVL3 (GOWN DISPOSABLE) ×3 IMPLANT
GOWN STRL REUS W/TWL XL LVL3 (GOWN DISPOSABLE) ×3
GUIDEWIRE PIN ORTH 6X1.6XSMTH (WIRE) IMPLANT
K-WIRE 1.6 (WIRE) ×1
KIT BASIN OR (CUSTOM PROCEDURE TRAY) ×1 IMPLANT
KIT TURNOVER KIT B (KITS) ×1 IMPLANT
MANIFOLD NEPTUNE II (INSTRUMENTS) ×1 IMPLANT
NS IRRIG 1000ML POUR BTL (IV SOLUTION) ×1 IMPLANT
PACK ORTHO EXTREMITY (CUSTOM PROCEDURE TRAY) ×1 IMPLANT
PAD ARMBOARD 7.5X6 YLW CONV (MISCELLANEOUS) ×2 IMPLANT
PLATE TUBULAR 8 H (Plate) IMPLANT
SCREW CANN 1/2THD 40X4.0 (Screw) IMPLANT
SCREW CORTICAL 3.5 16MM (Screw) IMPLANT
SCREW CORTICAL 3.5 18MM (Screw) IMPLANT
SCREW CORTICAL 3.5X12 (Screw) IMPLANT
SCREW CORTICAL 3.5X14 (Screw) IMPLANT
SPONGE T-LAP 18X18 ~~LOC~~+RFID (SPONGE) IMPLANT
STAPLER VISISTAT 35W (STAPLE) IMPLANT
SUCTION TUBE FRAZIER 10FR DISP (SUCTIONS) ×1 IMPLANT
SUT ETHILON 2 0 PSLX (SUTURE) IMPLANT
SUT VIC AB 2-0 CT1 27 (SUTURE)
SUT VIC AB 2-0 CT1 TAPERPNT 27 (SUTURE) ×1 IMPLANT
TOWEL GREEN STERILE (TOWEL DISPOSABLE) ×1 IMPLANT
TOWEL GREEN STERILE FF (TOWEL DISPOSABLE) ×1 IMPLANT
TUBE CONNECTING 12X1/4 (SUCTIONS) ×1 IMPLANT
YANKAUER SUCT BULB TIP NO VENT (SUCTIONS) IMPLANT

## 2022-10-02 NOTE — H&P (Signed)
Sheryl Howell is an 67 y.o. female.   Chief Complaint: Right ankle fracture. HPI: Patient is a 67 year old woman who was seen for initial evaluation for a Weber B bimalleolar right ankle fracture. Patient sustained a supination external rotation injury she was placed in a sugar-tong splint and is seen today for initial evaluation. Initial injury July 11.   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   Hypotension    Nausea    PONV (postoperative nausea and vomiting)    Seizures (HCC)     Past Surgical History:  Procedure Laterality Date   BUNIONECTOMY     both feet   CHOLECYSTECTOMY  04/08/2012   Procedure: LAPAROSCOPIC CHOLECYSTECTOMY WITH INTRAOPERATIVE CHOLANGIOGRAM;  Surgeon: Emelia Loron, MD;  Location: [facility];  Service: General;  Laterality: Right;  C-  Arm   COLONOSCOPY     DIAGNOSTIC LAPAROSCOPY  03/18/1990   exp lap fertility   ERCP  04/09/2012   Procedure: ENDOSCOPIC RETROGRADE CHOLANGIOPANCREATOGRAPHY (ERCP);  Surgeon: Iva Boop, MD;  Location: [facility];  Service: Endoscopy;  Laterality: N/A;   FOOT SURGERY     KNEE SURGERY Right    ACL repair   WRIST SURGERY Left    ganglion cyst removed    Family History  Problem Relation Age of Onset   Heart disease Other    Cancer Sister        Uterine   Cancer Maternal Uncle    Cancer Paternal Aunt        Breast   Breast cancer Maternal Aunt    Social History:  reports that she has never smoked. She has never used smokeless tobacco. She reports current alcohol use of  about 7.0 standard drinks of alcohol per week. She reports that she does not use drugs.  Allergies:  Allergies  Allergen Reactions   Sulfa Antibiotics Nausea Only    No medications prior to admission.    No results found for this or any previous visit (from the past 48 hour(s)). No results found.  Review of Systems  All other systems reviewed and are negative.   There were no vitals taken for this visit. Physical Exam  Patient is alert, oriented, no adenopathy, well-dressed, normal affect, normal respiratory effort. Examination patient is a good dorsalis pedis pulse she is developing equinus contracture there is swelling but no blisters.  She has some irritation of the skin medially from the sugar-tong splint we will place her in a cam boot walker.  Review of the radiographs shows a displaced bimalleolar ankle fracture with shortening of the fibula and displaced medial malleolus.  There is no posterior malleolar fragment. Assessment/Plan 1. Closed fracture of right ankle, initial encounter       Plan: Plan for open reduction internal fixation right ankle fracture.  Risk and benefits were discussed including infection neurovascular injury pain DVT need for additional surgery.  Patient states she understands wished to proceed  at this time she will use an aspirin postoperatively for DVT prophylaxis.  Patient will use a kneeling scooter postoperatively.  Nadara Mustard, MD 10/02/2022, 6:51 AM

## 2022-10-02 NOTE — Anesthesia Postprocedure Evaluation (Signed)
Anesthesia Post Note  Patient: Sheryl Howell  Procedure(s) Performed: OPEN REDUCTION INTERNAL FIXATION (ORIF) RIGHT ANKLE FRACTURE (Right: Ankle)     Patient location during evaluation: PACU Anesthesia Type: Regional and MAC Level of consciousness: awake and alert and oriented Pain management: pain level controlled Vital Signs Assessment: post-procedure vital signs reviewed and stable Respiratory status: spontaneous breathing, nonlabored ventilation and respiratory function stable Cardiovascular status: stable and blood pressure returned to baseline Postop Assessment: no apparent nausea or vomiting Anesthetic complications: no   No notable events documented.  Last Vitals:  Vitals:   10/02/22 1215 10/02/22 1230  BP: 97/65 (!) 99/52  Pulse: 62 63  Resp: 16 14  Temp: 36.5 C   SpO2: 94% 92%    Last Pain:  Vitals:   10/02/22 1215  TempSrc:   PainSc: 0-No pain                 [name] A.

## 2022-10-02 NOTE — Interval H&P Note (Signed)
History and Physical Interval Note:  10/02/2022 9:27 AM  Sheryl Howell  has presented today for surgery, with the diagnosis of Right Ankle Fracture.  The various methods of treatment have been discussed with the patient and family. After consideration of risks, benefits and other options for treatment, the patient has consented to  Procedure(s): OPEN REDUCTION INTERNAL FIXATION (ORIF) RIGHT ANKLE FRACTURE (Right) as a surgical intervention.  The patient's history has been reviewed, patient examined, no change in status, stable for surgery.  I have reviewed the patient's chart and labs.  Questions were answered to the patient's satisfaction.     Sheryl Howell

## 2022-10-02 NOTE — Anesthesia Procedure Notes (Signed)
Anesthesia Regional Block: Popliteal block   Pre-Anesthetic Checklist: , timeout performed,  Correct Patient, Correct Site, Correct Laterality,  Correct Procedure, Correct Position, site marked,  Risks and benefits discussed,  Surgical consent,  Pre-op evaluation,  At surgeon's request and post-op pain management  Laterality: Right  Prep: chloraprep       Needles:  Injection technique: Single-shot  Needle Type: Echogenic Stimulator Needle     Needle Length: 10cm  Needle Gauge: 21   Needle insertion depth: 7 cm   Additional Needles:   Procedures:,,,, ultrasound used (permanent image in chart),,    Narrative:  Start time: 10/02/2022 10:00 AM End time: 10/02/2022 10:05 AM Injection made incrementally with aspirations every 5 mL.  Performed by: Personally  Anesthesiologist: Mal Amabile, MD  Additional Notes: Timeout performed. Patient sedated. Relevant anatomy ID'd using [place]. Incremental 2-24ml injection of LA with frequent aspiration. Patient tolerated procedure well.     Right Popliteal Block

## 2022-10-02 NOTE — Transfer of Care (Signed)
Immediate Anesthesia Transfer of Care Note  Patient: Sheryl Howell  Procedure(s) Performed: OPEN REDUCTION INTERNAL FIXATION (ORIF) RIGHT ANKLE FRACTURE (Right: Ankle)  Patient Location: PACU  Anesthesia Type:MAC and Regional  Level of Consciousness: awake, alert , oriented, and drowsy  Airway & Oxygen Therapy: Patient Spontanous Breathing and Patient connected to nasal cannula oxygen  Post-op Assessment: Report given to RN and Post -op Vital signs reviewed and stable  Post vital signs: Reviewed and stable  Last Vitals:  Vitals Value Taken Time  BP 94/61 10/02/22 1146  Temp 36.4 C 10/02/22 1145  Pulse 69 10/02/22 1153  Resp 18 10/02/22 1153  SpO2 92 % 10/02/22 1153  Vitals shown include unfiled device data.  Last Pain:  Vitals:   10/02/22 1145  TempSrc:   PainSc: 0-No pain         Complications: No notable events documented.

## 2022-10-02 NOTE — Anesthesia Preprocedure Evaluation (Signed)
Anesthesia Evaluation  Patient identified by MRN, date of birth, ID band Patient awake    History of Anesthesia Complications (+) PONV and history of anesthetic complications  Airway Mallampati: II       Dental no notable dental hx. (+) Caps, Dental Advisory Given   Pulmonary neg pulmonary ROS   Pulmonary exam normal breath sounds clear to auscultation       Cardiovascular negative cardio ROS Normal cardiovascular exam Rhythm:Regular Rate:Normal     Neuro/Psych Seizures -, Well Controlled,  PSYCHIATRIC DISORDERS Anxiety Depression       GI/Hepatic negative GI ROS,,,(+) Hepatitis -, B  Endo/Other  Hyperlipidemia  Renal/GU negative Renal ROS  negative genitourinary   Musculoskeletal  (+) Arthritis , Osteoarthritis,  Bimalleolar Fx right ankle   Abdominal  (+) + obese  Peds  Hematology  (+) Blood dyscrasia, anemia   Anesthesia Other Findings   Reproductive/Obstetrics                              Anesthesia Physical Anesthesia Plan  ASA: 3  Anesthesia Plan: General   Post-op Pain Management: Regional block* and Minimal or no pain anticipated   Induction: Intravenous  PONV Risk Score and Plan: 4 or greater and Treatment may vary due to age or medical condition and Ondansetron  Airway Management Planned: LMA  Additional Equipment: None  Intra-op Plan:   Post-operative Plan: Extubation in OR  Informed Consent: I have reviewed the patients History and Physical, chart, labs and discussed the procedure including the risks, benefits and alternatives for the proposed anesthesia with the patient or authorized representative who has indicated his/her understanding and acceptance.     Dental advisory given  Plan Discussed with: CRNA and Anesthesiologist  Anesthesia Plan Comments:          Anesthesia Quick Evaluation

## 2022-10-02 NOTE — Progress Notes (Addendum)
Nasal PCR positive for MRSA. Pt received nasal betadine prior to surgery. Patient is currently discharged and no longer at hospital. Dr Lajoyce Corners notified, no further orders at this time.

## 2022-10-02 NOTE — Op Note (Signed)
10/02/2022  11:37 AM  PATIENT:  Sheryl Howell    PRE-OPERATIVE DIAGNOSIS:  Right Ankle bimalleolar fracture  POST-OPERATIVE DIAGNOSIS:  Same  PROCEDURE:  OPEN REDUCTION INTERNAL FIXATION (ORIF) RIGHT ANKLE BIMALLEOLAR FRACTURE  C-arm fluoroscopy used to verify reduction.  SURGEON:  Nadara Mustard, MD  PHYSICIAN ASSISTANT:None ANESTHESIA:   General  PREOPERATIVE INDICATIONS:  Sheryl Howell is a  67 y.o. female with a diagnosis of Right Ankle Fracture who failed conservative measures and elected for surgical management.    The risks benefits and alternatives were discussed with the patient preoperatively including but not limited to the risks of infection, bleeding, nerve injury, cardiopulmonary complications, the need for revision surgery, among others, and the patient was willing to proceed.  OPERATIVE IMPLANTS:   Implant Name Type Inv. Item Serial No. Manufacturer Lot No. LRB No. Used Action  SCREW CORTICAL 3.5 - [id number] Screw SCREW CORTICAL 3.5  [employer](ORTH,TRAU,[employer],SG)  Right 1 Implanted  PLATE TUBULAR 8 H - [id number] Plate PLATE TUBULAR 8 H  [employer](ORTH,TRAU,BIO,SG)  Right 1 Implanted  SCREW CORTICAL 3.5X14 - [id number] Screw SCREW CORTICAL 3.5X14  [employer](ORTH,TRAU,BIO,SG)  Right 2 Implanted  SCREW CORTICAL 3.5X12 - [id number] Screw SCREW CORTICAL 3.5X12  [employer](ORTH,TRAU,BIO,SG)  Right 2 Implanted  SCREW CORTICAL 3.5 - [id number] Screw SCREW CORTICAL 3.5  [employer](ORTH,TRAU,BIO,SG)  Right 1 Implanted  SCREW CANN 1/2THD 40X4.0 - [id number] Screw SCREW CANN 1/2THD 40X4.0  [employer](ORTH,TRAU,BIO,SG)  Right 1 Implanted    @ENCIMAGES @  OPERATIVE FINDINGS: C-arm fluoroscopy verified congruent mortise after reduction.  There was 1 small posterior fragment off the medial malleolus that was too small to be secured with internal fixation and this was secured with absorbable suture.  OPERATIVE PROCEDURE:  Patient brought the operating room underwent regional anesthetic.  After adequate levels anesthesia were obtained patient's right lower extremity was prepped using DuraPrep draped into a sterile field a timeout was called.  A lateral incision was made over the fibula.  This was carried sharply down to bone.  Subperiosteal dissection was used to debride the fracture site.  This was cleansed reduced and stabilized with a 18 mm interfrag screw.  A neutralization plate was contoured and placed laterally.  This was secured distally with a compression screw and proximally.  Additional screws were placed for stabilization.  C-arm Shrosbree verified the fibula was out to length and no syndesmosis widening.  This was irrigated with normal saline incision closed using 2-0 nylon.  A separate incision was then made over the medial malleolus.  Subperiosteal dissection was used to freshen the edges and the large medial malleolar fragment was secured with a cannulated 40 mm screw.  There is a small posterior fragment that was too small for fixation and this was secured in face with th retinacular fascia with 2-0 Vicryl.  The wound was irrigated with normal saline incision closed using 2-0 nylon.  Sterile dressing was applied patient was taken the PACU in stable condition.   DISCHARGE PLANNING:  Antibiotic duration: Preoperative antibiotics  Weightbearing: Nonweightbearing on the right  Pain medication: Prescription for Vicodin  Dressing care/ Wound VAC: Dry dressing  Ambulatory devices: Kneeling scooter, walker, crutches  Discharge to: Home.  Follow-up: In the office 1 week post operative.

## 2022-10-02 NOTE — Anesthesia Procedure Notes (Signed)
Anesthesia Regional Block: Adductor canal block   Pre-Anesthetic Checklist: , timeout performed,  Correct Patient, Correct Site, Correct Laterality,  Correct Procedure, Correct Position, site marked,  Risks and benefits discussed,  Surgical consent,  Pre-op evaluation,  At surgeon's request and post-op pain management  Laterality: Right  Prep: chloraprep       Needles:  Injection technique: Single-shot  Needle Type: Echogenic Stimulator Needle     Needle Length: 10cm  Needle Gauge: 21   Needle insertion depth: 7 cm   Additional Needles:   Procedures:,,,, ultrasound used (permanent image in chart),,    Narrative:  Start time: 10/02/2022 10:05 AM End time: 10/02/2022 10:10 AM Injection made incrementally with aspirations every 5 mL.  Performed by: Personally  Anesthesiologist: Mal Amabile, MD  Additional Notes: Timeout performed. Patient sedated. Relevant anatomy ID'd using [place]. Incremental 2-14ml injection of LA with frequent aspiration. Patient tolerated procedure well.       Right Adductor Canal Block

## 2022-10-03 ENCOUNTER — Encounter (HOSPITAL_COMMUNITY): Payer: Self-pay | Admitting: Orthopedic Surgery

## 2022-10-03 ENCOUNTER — Other Ambulatory Visit (HOSPITAL_BASED_OUTPATIENT_CLINIC_OR_DEPARTMENT_OTHER): Payer: Self-pay

## 2022-10-03 ENCOUNTER — Other Ambulatory Visit (HOSPITAL_BASED_OUTPATIENT_CLINIC_OR_DEPARTMENT_OTHER): Payer: Self-pay | Admitting: Physician Assistant

## 2022-10-03 MED ORDER — ONDANSETRON HCL 4 MG PO TABS
4.0000 mg | ORAL_TABLET | Freq: Three times a day (TID) | ORAL | 0 refills | Status: DC | PRN
  Filled 2022-10-03: qty 20, 7d supply, fill #0

## 2022-10-08 ENCOUNTER — Encounter: Payer: Self-pay | Admitting: Orthopedic Surgery

## 2022-10-08 ENCOUNTER — Telehealth: Payer: Self-pay | Admitting: Orthopedic Surgery

## 2022-10-08 NOTE — Telephone Encounter (Signed)
Patient called. She needs a letter typed for an upcoming trip she is going on. Needs it to say certain things. Has written out just needs it signed and on letterhead. Would like Autumn to call her. [phone]

## 2022-10-09 NOTE — Telephone Encounter (Signed)
Responded to [employer]. Letter is done and pending signature tomorrow.

## 2022-10-11 ENCOUNTER — Ambulatory Visit: Payer: Medicare PPO | Admitting: Family

## 2022-10-11 ENCOUNTER — Encounter: Payer: Self-pay | Admitting: Family

## 2022-10-11 ENCOUNTER — Other Ambulatory Visit: Payer: Self-pay

## 2022-10-11 DIAGNOSIS — S82891A Other fracture of right lower leg, initial encounter for closed fracture: Secondary | ICD-10-CM

## 2022-10-11 NOTE — Progress Notes (Signed)
Post-Op Visit Note   Patient: Sheryl Howell           Date of Birth: March 05, 1956           MRN: [id number] Visit Date: 10/11/2022 PCP: Alfredo Martinez, MD  Chief Complaint: No chief complaint on file.   HPI:  HPI The patient is a 67 year old woman seen status post right ankle fracture. Is 10 days out. Has been in cam walker nonweightbearing.  Ortho Exam Incisions are clean dry and intact. Minimal edema.  Visit Diagnoses: No diagnosis found.  Plan: begin daily dial soap cleansing. Dry dressings. Cam walker. Nonweight bearing. Elevate for swelling. Will follow up in 1 more week for suture removal  Follow-Up Instructions: No follow-ups on file.   Imaging: No results found.  Orders:  No orders of the defined types were placed in this encounter.  No orders of the defined types were placed in this encounter.    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   Hx of seizure disorder 04/09/2012   Anemia 04/09/2012   Symptomatic cholelithiasis 04/07/2012   Acute hepatitis B 11/29/2011   Overweight(278.02) 05/27/2011   INSOMNIA 10/11/2009   Inguinal pain, left 10/11/2009   Anxiety and depression 09/14/2009   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   Past Medical History:  Diagnosis Date   Adverse effect of anesthetic    bp low-takes awhile to come up post op   Anemia    Anemia 04/09/2012   Hx of anemia   Anxiety    Arthritis    Cancer (HCC)    squamous cell skin cancer    Cholelithiasis and acute cholecystitis with obstruction 04/09/2012   Depression    Hepatitis B    9/13-dx hep b, HB S ab + 04/07/2012   Hx of anxiety disorder 04/09/2012   Hx of seizure disorder 04/09/2012   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   Hypotension    Nausea    PONV (postoperative nausea and vomiting)    Seizures (HCC)    related to BP dropping per patient    Family History  Problem Relation Age of Onset   Heart disease Other    Cancer Sister        Uterine   Cancer Maternal Uncle    Cancer Paternal Aunt        Breast   Breast cancer Maternal Aunt     Past Surgical History:  Procedure Laterality Date   BUNIONECTOMY     both feet   CHOLECYSTECTOMY  04/08/2012   Procedure: LAPAROSCOPIC CHOLECYSTECTOMY WITH INTRAOPERATIVE CHOLANGIOGRAM;  Surgeon: Emelia Loron, MD;  Location: [facility];  Service: General;  Laterality: Right;  C-  Arm   COLONOSCOPY     DIAGNOSTIC LAPAROSCOPY  03/18/1990   exp lap fertility   ERCP  04/09/2012   Procedure: ENDOSCOPIC RETROGRADE CHOLANGIOPANCREATOGRAPHY (ERCP);  Surgeon: Iva Boop, MD;  Location: [facility];  Service: Endoscopy;  Laterality: N/A;   FOOT SURGERY  KNEE SURGERY Right    ACL repair   ORIF ANKLE FRACTURE Right 10/02/2022   Procedure: OPEN REDUCTION INTERNAL FIXATION (ORIF) RIGHT ANKLE FRACTURE;  Surgeon: Nadara Mustard, MD;  Location: [facility];  Service: Orthopedics;  Laterality: Right;   WRIST SURGERY Left    ganglion cyst removed   Social History   Occupational History   Occupation: Retired   Occupation: Part-time  Tobacco Use   Smoking status: Never   Smokeless tobacco: Never  Vaping Use   Vaping status: Never Used  Substance and Sexual Activity   Alcohol use: Yes    Alcohol/week: 7.0 standard drinks of alcohol    Types: 7 Glasses of wine per week    Comment: glasses white wine daily   Drug use: No   Sexual activity: Not on file

## 2022-10-14 DIAGNOSIS — F411 Generalized anxiety disorder: Secondary | ICD-10-CM | POA: Diagnosis not present

## 2022-10-14 DIAGNOSIS — F3341 Major depressive disorder, recurrent, in partial remission: Secondary | ICD-10-CM | POA: Diagnosis not present

## 2022-10-14 DIAGNOSIS — F1010 Alcohol abuse, uncomplicated: Secondary | ICD-10-CM | POA: Diagnosis not present

## 2022-10-17 ENCOUNTER — Ambulatory Visit (INDEPENDENT_AMBULATORY_CARE_PROVIDER_SITE_OTHER): Payer: Medicare PPO | Admitting: Orthopedic Surgery

## 2022-10-17 DIAGNOSIS — F411 Generalized anxiety disorder: Secondary | ICD-10-CM | POA: Diagnosis not present

## 2022-10-17 DIAGNOSIS — S82891A Other fracture of right lower leg, initial encounter for closed fracture: Secondary | ICD-10-CM

## 2022-10-17 DIAGNOSIS — F330 Major depressive disorder, recurrent, mild: Secondary | ICD-10-CM | POA: Diagnosis not present

## 2022-10-30 ENCOUNTER — Encounter: Payer: Self-pay | Admitting: Orthopedic Surgery

## 2022-10-30 NOTE — Progress Notes (Signed)
Patient is a 67 year old woman who is 2 weeks status post open reduction internal fixation right ankle.  Sutures are removed today she has been in a fracture boot nonweightbearing.  She will advance to weightbearing as tolerated in a short fracture boot we gave her a compression sock.  Previous radiographs shows a congruent ankle.  Three-view radiographs of the right ankle at follow-up.

## 2022-11-01 ENCOUNTER — Ambulatory Visit
Admission: RE | Admit: 2022-11-01 | Discharge: 2022-11-01 | Disposition: A | Discharge status: Home / Self Care | Payer: Medicare PPO | Source: Ambulatory Visit | Attending: Family Medicine | Admitting: Family Medicine

## 2022-11-01 DIAGNOSIS — E349 Endocrine disorder, unspecified: Secondary | ICD-10-CM | POA: Diagnosis not present

## 2022-11-01 DIAGNOSIS — E2839 Other primary ovarian failure: Secondary | ICD-10-CM

## 2022-11-01 DIAGNOSIS — M8588 Other specified disorders of bone density and structure, other site: Secondary | ICD-10-CM | POA: Diagnosis not present

## 2022-11-01 DIAGNOSIS — N958 Other specified menopausal and perimenopausal disorders: Secondary | ICD-10-CM | POA: Diagnosis not present

## 2022-11-03 ENCOUNTER — Encounter: Payer: Self-pay | Admitting: Student

## 2022-11-04 DIAGNOSIS — F411 Generalized anxiety disorder: Secondary | ICD-10-CM | POA: Diagnosis not present

## 2022-11-04 DIAGNOSIS — F1010 Alcohol abuse, uncomplicated: Secondary | ICD-10-CM | POA: Diagnosis not present

## 2022-11-04 DIAGNOSIS — F3341 Major depressive disorder, recurrent, in partial remission: Secondary | ICD-10-CM | POA: Diagnosis not present

## 2022-11-15 ENCOUNTER — Other Ambulatory Visit: Payer: Self-pay

## 2022-11-15 ENCOUNTER — Ambulatory Visit: Payer: Medicare PPO | Admitting: Family

## 2022-11-15 ENCOUNTER — Encounter: Payer: Self-pay | Admitting: Family

## 2022-11-15 DIAGNOSIS — M25571 Pain in right ankle and joints of right foot: Secondary | ICD-10-CM

## 2022-11-15 DIAGNOSIS — S82891A Other fracture of right lower leg, initial encounter for closed fracture: Secondary | ICD-10-CM

## 2022-11-15 NOTE — Progress Notes (Signed)
Post-Op Visit Note   Patient: Sheryl Howell           Date of Birth: January 08, 1956           MRN: [id number] Visit Date: 11/15/2022 PCP: Alfredo Martinez, MD  Chief Complaint:  Chief Complaint  Patient presents with   Right Ankle - Routine Post Op    10/02/2022 ORIF right ankle fracture     HPI:  HPI The patient is a 67 year old woman who is seen status post open reduction internal fixation right fracture she has been weightbearing as tolerated in her cam walker since last visit.  She states she has walked some short distances in the home without the boot and felt well without pain Ortho Exam Incisions well-healed no edema no erythema good range of motion of the ankle.  Visit Diagnoses:  1. Pain in right ankle and joints of right foot     Plan: May begin full weightbearing in regular shoewear.  She will follow-up in the office in 3 to 4 weeks if she fails to improve as expected  Follow-Up Instructions: Return in about 4 weeks (around 12/13/2022), or if symptoms worsen or fail to improve.   Imaging: XR Ankle Complete Right  Result Date: 11/15/2022 Radiographs of the right ankle show reduction with hardware placement.  There is no complicating feature.   Orders:  Orders Placed This Encounter  Procedures   XR Ankle Complete Right   No orders of the defined types were placed in this encounter.    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   Hx of seizure disorder 04/09/2012    Anemia 04/09/2012   Symptomatic cholelithiasis 04/07/2012   Acute hepatitis B 11/29/2011   Overweight(278.02) 05/27/2011   INSOMNIA 10/11/2009   Inguinal pain, left 10/11/2009   Anxiety and depression 09/14/2009   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   Hypotension    Nausea    PONV (postoperative nausea and vomiting)    Seizures (HCC)    related to BP dropping per patient    Family History  Problem Relation Age of Onset   Heart disease Other    Cancer Sister        Uterine   Cancer Maternal Uncle    Cancer Paternal Aunt        Breast   Breast cancer Maternal Aunt     Past  Surgical History:  Procedure Laterality Date   BUNIONECTOMY     both feet   CHOLECYSTECTOMY  04/08/2012   Procedure: LAPAROSCOPIC CHOLECYSTECTOMY WITH INTRAOPERATIVE CHOLANGIOGRAM;  Surgeon: Emelia Loron, MD;  Location: [facility];  Service: General;  Laterality: Right;  C-  Arm   COLONOSCOPY     DIAGNOSTIC LAPAROSCOPY  03/18/1990   exp lap fertility   ERCP  04/09/2012   Procedure: ENDOSCOPIC RETROGRADE CHOLANGIOPANCREATOGRAPHY (ERCP);  Surgeon: Iva Boop, MD;  Location: [facility];  Service: Endoscopy;  Laterality: N/A;   FOOT SURGERY     KNEE SURGERY Right    ACL repair   ORIF ANKLE FRACTURE Right 10/02/2022   Procedure: OPEN REDUCTION INTERNAL FIXATION (ORIF) RIGHT ANKLE FRACTURE;  Surgeon: Nadara Mustard, MD;  Location: [facility] OR;  Service: Orthopedics;  Laterality: Right;   WRIST SURGERY Left    ganglion cyst removed    Social History   Occupational History   Occupation: Retired   Occupation: Part-time  Tobacco Use   Smoking status: Never   Smokeless tobacco: Never  Vaping Use   Vaping status: Never Used  Substance and Sexual Activity   Alcohol use: Yes    Alcohol/week: 7.0 standard drinks of alcohol    Types: 7 Glasses of wine per week    Comment: glasses white wine daily   Drug use: No   Sexual activity: Not on file

## 2022-11-25 DIAGNOSIS — F1010 Alcohol abuse, uncomplicated: Secondary | ICD-10-CM | POA: Diagnosis not present

## 2022-11-25 DIAGNOSIS — F3341 Major depressive disorder, recurrent, in partial remission: Secondary | ICD-10-CM | POA: Diagnosis not present

## 2022-11-25 DIAGNOSIS — F411 Generalized anxiety disorder: Secondary | ICD-10-CM | POA: Diagnosis not present

## 2022-12-12 DIAGNOSIS — L821 Other seborrheic keratosis: Secondary | ICD-10-CM | POA: Diagnosis not present

## 2022-12-12 DIAGNOSIS — L738 Other specified follicular disorders: Secondary | ICD-10-CM | POA: Diagnosis not present

## 2022-12-12 DIAGNOSIS — L570 Actinic keratosis: Secondary | ICD-10-CM | POA: Diagnosis not present

## 2022-12-23 DIAGNOSIS — F3341 Major depressive disorder, recurrent, in partial remission: Secondary | ICD-10-CM | POA: Diagnosis not present

## 2022-12-23 DIAGNOSIS — F411 Generalized anxiety disorder: Secondary | ICD-10-CM | POA: Diagnosis not present

## 2022-12-31 ENCOUNTER — Other Ambulatory Visit: Payer: Self-pay | Admitting: Student

## 2022-12-31 DIAGNOSIS — Z1231 Encounter for screening mammogram for malignant neoplasm of breast: Secondary | ICD-10-CM

## 2023-01-10 ENCOUNTER — Ambulatory Visit
Admission: RE | Admit: 2023-01-10 | Discharge: 2023-01-10 | Disposition: A | Discharge status: Home / Self Care | Payer: Medicare PPO | Source: Ambulatory Visit | Attending: Family Medicine | Admitting: Family Medicine

## 2023-01-10 DIAGNOSIS — Z1231 Encounter for screening mammogram for malignant neoplasm of breast: Secondary | ICD-10-CM

## 2023-01-13 DIAGNOSIS — F3341 Major depressive disorder, recurrent, in partial remission: Secondary | ICD-10-CM | POA: Diagnosis not present

## 2023-01-13 DIAGNOSIS — F411 Generalized anxiety disorder: Secondary | ICD-10-CM | POA: Diagnosis not present

## 2023-01-13 DIAGNOSIS — F1010 Alcohol abuse, uncomplicated: Secondary | ICD-10-CM | POA: Diagnosis not present

## 2023-01-27 DIAGNOSIS — F3341 Major depressive disorder, recurrent, in partial remission: Secondary | ICD-10-CM | POA: Diagnosis not present

## 2023-01-27 DIAGNOSIS — F411 Generalized anxiety disorder: Secondary | ICD-10-CM | POA: Diagnosis not present

## 2023-01-27 DIAGNOSIS — F1010 Alcohol abuse, uncomplicated: Secondary | ICD-10-CM | POA: Diagnosis not present

## 2023-02-06 DIAGNOSIS — F411 Generalized anxiety disorder: Secondary | ICD-10-CM | POA: Diagnosis not present

## 2023-02-06 DIAGNOSIS — F331 Major depressive disorder, recurrent, moderate: Secondary | ICD-10-CM | POA: Diagnosis not present

## 2023-02-17 DIAGNOSIS — F1090 Alcohol use, unspecified, uncomplicated: Secondary | ICD-10-CM | POA: Diagnosis not present

## 2023-02-17 DIAGNOSIS — F3341 Major depressive disorder, recurrent, in partial remission: Secondary | ICD-10-CM | POA: Diagnosis not present

## 2023-02-17 DIAGNOSIS — F411 Generalized anxiety disorder: Secondary | ICD-10-CM | POA: Diagnosis not present

## 2023-03-06 DIAGNOSIS — F331 Major depressive disorder, recurrent, moderate: Secondary | ICD-10-CM | POA: Diagnosis not present

## 2023-03-06 DIAGNOSIS — F411 Generalized anxiety disorder: Secondary | ICD-10-CM | POA: Diagnosis not present

## 2023-03-10 DIAGNOSIS — F411 Generalized anxiety disorder: Secondary | ICD-10-CM | POA: Diagnosis not present

## 2023-03-10 DIAGNOSIS — F1090 Alcohol use, unspecified, uncomplicated: Secondary | ICD-10-CM | POA: Diagnosis not present

## 2023-03-10 DIAGNOSIS — F3341 Major depressive disorder, recurrent, in partial remission: Secondary | ICD-10-CM | POA: Diagnosis not present

## 2023-03-31 DIAGNOSIS — F1090 Alcohol use, unspecified, uncomplicated: Secondary | ICD-10-CM | POA: Diagnosis not present

## 2023-03-31 DIAGNOSIS — F3341 Major depressive disorder, recurrent, in partial remission: Secondary | ICD-10-CM | POA: Diagnosis not present

## 2023-03-31 DIAGNOSIS — F411 Generalized anxiety disorder: Secondary | ICD-10-CM | POA: Diagnosis not present

## 2023-04-03 DIAGNOSIS — F411 Generalized anxiety disorder: Secondary | ICD-10-CM | POA: Diagnosis not present

## 2023-04-03 DIAGNOSIS — F330 Major depressive disorder, recurrent, mild: Secondary | ICD-10-CM | POA: Diagnosis not present

## 2023-04-08 ENCOUNTER — Telehealth: Payer: Medicare PPO | Admitting: Physician Assistant

## 2023-04-08 DIAGNOSIS — R3989 Other symptoms and signs involving the genitourinary system: Secondary | ICD-10-CM | POA: Diagnosis not present

## 2023-04-08 DIAGNOSIS — N3281 Overactive bladder: Secondary | ICD-10-CM

## 2023-04-08 MED ORDER — OXYBUTYNIN CHLORIDE ER 5 MG PO TB24
5.0000 mg | ORAL_TABLET | Freq: Every day | ORAL | 0 refills | Status: DC
Start: 2023-04-08 — End: 2023-04-28

## 2023-04-08 MED ORDER — NITROFURANTOIN MONOHYD MACRO 100 MG PO CAPS: 100.0000 mg | ORAL_CAPSULE | Freq: Two times a day (BID) | ORAL | 0 refills | Status: DC

## 2023-04-08 NOTE — Progress Notes (Signed)
Virtual Visit Consent   Sheryl Howell, you are scheduled for a virtual visit with a [facility] provider today. Just as with appointments in the office, your consent must be obtained to participate. Your consent will be active for this visit and any virtual visit you may have with one of our providers in the next 365 days. If you have a MyChart account, a copy of this consent can be sent to you electronically.  As this is a virtual visit, video technology does not allow for your provider to perform a traditional examination. This may limit your provider's ability to fully assess your condition. If your provider identifies any concerns that need to be evaluated in person or the need to arrange testing (such as labs, EKG, etc.), we will make arrangements to do so. Although advances in technology are sophisticated, we cannot ensure that it will always work on either your end or our end. If the connection with a video visit is poor, the visit may have to be switched to a telephone visit. With either a video or telephone visit, we are not always able to ensure that we have a secure connection.  By engaging in this virtual visit, you consent to the provision of healthcare and authorize for your insurance to be billed (if applicable) for the services provided during this visit. Depending on your insurance coverage, you may receive a charge related to this service.  I need to obtain your verbal consent now. Are you willing to proceed with your visit today? Sheryl Howell has provided verbal consent on 04/08/2023 for a virtual visit (video or telephone). Margaretann Loveless, PA-C  Date: 04/08/2023 3:42 PM  Virtual Visit via Video Note   I, Margaretann Loveless, connected with  Sheryl Howell  ([id number], 1955-12-01) on 04/08/23 at  3:15 PM EST by a video-enabled telemedicine application and verified that I am speaking with the correct person using two  identifiers.  Location: Patient: Virtual Visit Location Patient: Home Provider: Virtual Visit Location Provider: Home Office   I discussed the limitations of evaluation and management by telemedicine and the availability of in person appointments. The patient expressed understanding and agreed to proceed.    History of Present Illness: Sheryl Howell is a 68 y.o. who identifies as a female who was assigned female at birth, and is being seen today for urine frequency and urgency.  HPI: Urinary Tract Infection  This is a new problem. The current episode started 1 to 4 weeks ago (2 weeks). The problem occurs every urination. The problem has been gradually worsening. The quality of the pain is described as burning. The pain is mild. There has been no fever. Associated symptoms include frequency, hesitancy and urgency. Pertinent negatives include no chills, discharge, flank pain, hematuria or nausea. She has tried increased fluids for the symptoms. The treatment provided no relief.      Problems:  Patient Active Problem List   Diagnosis Date Noted   Closed right ankle fracture 09/27/2022   Non-cardiac chest pain 06/25/2020   Arthritis 11/18/2019   Pain of ovary 04/12/2019   Healthcare maintenance 04/12/2019   Ear pain, left 04/12/2019   Decreased hearing of both ears 01/01/2018   Vomiting and diarrhea 02/18/2017   Dizziness 02/05/2016   Chronic fatigue 12/14/2015   Olecranon bursitis of right elbow 10/23/2015   Hyperlipidemia 06/29/2014   Sebaceous cyst 02/16/2014   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   Hx of seizure disorder 04/09/2012   Anemia 04/09/2012  Symptomatic cholelithiasis 04/07/2012   Acute hepatitis B 11/29/2011   Overweight 05/27/2011   INSOMNIA 10/11/2009   Inguinal pain, left 10/11/2009   Anxiety and depression 09/14/2009   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    Allergies:  Allergies  Allergen Reactions   Sulfa Antibiotics Nausea Only   Medications:   Current Outpatient Medications:    nitrofurantoin, macrocrystal-monohydrate, (MACROBID) 100 MG capsule, Take 1 capsule (100 mg total) by mouth 2 (two) times daily., Disp: 10 capsule, Rfl: 0   oxybutynin (DITROPAN-XL) 5 MG 24 hr tablet, Take 1 tablet (5 mg total) by mouth at bedtime., Disp: 15 tablet, Rfl: 0   buPROPion (WELLBUTRIN XL) 300 MG 24 hr tablet, Take 300 mg by mouth in the morning., Disp: , Rfl:    busPIRone (BUSPAR) 5 MG tablet, Take 5 mg by mouth 2 (two) times daily., Disp: , Rfl:    Cholecalciferol (VITAMIN D-3 PO), Take 1 tablet by mouth every evening., Disp: , Rfl:    citalopram (CELEXA) 20 MG tablet, TAKE 1 TABLET BY MOUTH EVERY DAY (Patient taking differently: Take 20 mg by mouth every evening.), Disp: 90 tablet, Rfl: 2   Cyanocobalamin (VITAMIN B-12 PO), Take 1 tablet by mouth in the morning., Disp: , Rfl:    HYDROcodone-acetaminophen (NORCO/VICODIN) 5-325 MG tablet, Take 1 tablet by mouth every 4 (four) hours as needed., Disp: 30 tablet, Rfl: 0   loratadine (CLARITIN) 10 MG tablet, Take 10 mg by mouth every evening., Disp: , Rfl:    ondansetron (ZOFRAN) 4 MG tablet, Take 1 tablet (4 mg total) by mouth every 8 (eight) hours as needed for nausea or vomiting., Disp: 20 tablet, Rfl: 0  Observations/Objective: Patient is well-developed, well-nourished in no acute distress.  Resting comfortably at home.  Head is normocephalic, atraumatic.  No labored breathing.  Speech is clear and coherent with logical content.  Patient is alert and oriented at baseline.    Assessment and Plan: 1. Overactive bladder (Primary) - oxybutynin (DITROPAN-XL) 5 MG 24 hr tablet; Take 1 tablet (5 mg total) by mouth at bedtime.  Dispense: 15 tablet; Refill: 0 - nitrofurantoin, macrocrystal-monohydrate, (MACROBID) 100 MG capsule; Take 1 capsule (100 mg total) by mouth 2 (two) times daily.  Dispense: 10 capsule; Refill: 0  2. Suspected UTI - nitrofurantoin, macrocrystal-monohydrate, (MACROBID) 100 MG  capsule; Take 1 capsule (100 mg total) by mouth 2 (two) times daily.  Dispense: 10 capsule; Refill: 0  - Having urine frequency and hesitancy - Possible bacterial source since acute onset, but discussed functional causes possible as well - Will trial Macrobid for bacterial source - Add short course of Oxybutynin XL as well for OAB - Limit caffeine and alcohol - Follow up in person if symptoms worsen acutely or fail to improve with treatment  Follow Up Instructions: I discussed the assessment and treatment plan with the patient. The patient was provided an opportunity to ask questions and all were answered. The patient agreed with the plan and demonstrated an understanding of the instructions.  A copy of instructions were sent to the patient via MyChart unless otherwise noted below.    The patient was advised to call back or seek an in-person evaluation if the symptoms worsen or if the condition fails to improve as anticipated.    Margaretann Loveless, PA-C

## 2023-04-08 NOTE — Patient Instructions (Signed)
Sheryl Howell, thank you for joining Margaretann Loveless, PA-C for today's virtual visit.  While this provider is not your primary care provider (PCP), if your PCP is located in our provider database this encounter information will be shared with them immediately following your visit.   A [facility] MyChart account gives you access to today's visit and all your visits, tests, and labs performed at [facility] " click here if you don't have a [facility] MyChart account or go to [email or web address]  Consent: (Patient) Sheryl Howell provided verbal consent for this virtual visit at the beginning of the encounter.  Current Medications:  Current Outpatient Medications:    nitrofurantoin, macrocrystal-monohydrate, (MACROBID) 100 MG capsule, Take 1 capsule (100 mg total) by mouth 2 (two) times daily., Disp: 10 capsule, Rfl: 0   oxybutynin (DITROPAN-XL) 5 MG 24 hr tablet, Take 1 tablet (5 mg total) by mouth at bedtime., Disp: 15 tablet, Rfl: 0   buPROPion (WELLBUTRIN XL) 300 MG 24 hr tablet, Take 300 mg by mouth in the morning., Disp: , Rfl:    busPIRone (BUSPAR) 5 MG tablet, Take 5 mg by mouth 2 (two) times daily., Disp: , Rfl:    Cholecalciferol (VITAMIN D-3 PO), Take 1 tablet by mouth every evening., Disp: , Rfl:    citalopram (CELEXA) 20 MG tablet, TAKE 1 TABLET BY MOUTH EVERY DAY (Patient taking differently: Take 20 mg by mouth every evening.), Disp: 90 tablet, Rfl: 2   Cyanocobalamin (VITAMIN B-12 PO), Take 1 tablet by mouth in the morning., Disp: , Rfl:    HYDROcodone-acetaminophen (NORCO/VICODIN) 5-325 MG tablet, Take 1 tablet by mouth every 4 (four) hours as needed., Disp: 30 tablet, Rfl: 0   loratadine (CLARITIN) 10 MG tablet, Take 10 mg by mouth every evening., Disp: , Rfl:    ondansetron (ZOFRAN) 4 MG tablet, Take 1 tablet (4 mg total) by mouth every 8 (eight) hours as needed for nausea or vomiting., Disp: 20 tablet, Rfl: 0   Medications ordered in  this encounter:  Meds ordered this encounter  Medications   oxybutynin (DITROPAN-XL) 5 MG 24 hr tablet    Sig: Take 1 tablet (5 mg total) by mouth at bedtime.    Dispense:  15 tablet    Refill:  0    Supervising Provider:   Merrilee Jansky [[id number]]   nitrofurantoin, macrocrystal-monohydrate, (MACROBID) 100 MG capsule    Sig: Take 1 capsule (100 mg total) by mouth 2 (two) times daily.    Dispense:  10 capsule    Refill:  0    Supervising Provider:   Merrilee Jansky [[id number]]     *If you need refills on other medications prior to your next appointment, please contact your pharmacy*  Follow-Up: Call back or seek an in-person evaluation if the symptoms worsen or if the condition fails to improve as anticipated.  [facility] [phone]  Other Instructions Overactive Bladder in Adults: What to Know  Overactive bladder (OAB) is when you have trouble holding your pee. You may feel the need to pee often or all of a sudden. You may also leak pee if you can't get to a bathroom fast enough. These symptoms may get in the way of your daily life. What are the causes? OAB may be caused by poor nerve signals between your bladder and your brain. Your bladder may get the signal to empty before it's full. Or, your bladder may be too sensitive. This can cause it to squeeze too  soon. Other causes include: Medical problems, such as: A urinary tract infection (UTI). Swelling or stones in your bladder. A tumor, which is a growth of cells that isn't normal. Diabetes. Muscle or nerve weakness. This may be caused by: An injury to your spinal cord. A stroke. Multiple sclerosis. Surgery on nearby tissues. Drinking caffeine or alcohol. Some medicines. These include ones to lower the amount of fluid in your body. Trouble pooping, also called constipation. What increases the risk? You may be more at risk if: You're an older adult. You're going through menopause. You have a big  prostate or problems with your prostate. You're overweight. You smoke. You eat or drink things that irritate your bladder. These may include tea or spicy food. What are the signs or symptoms? A sudden, strong need to pee. Peeing 8 or more times a day. Leaking pee. Waking up to pee 2 or more times a night. How is this diagnosed? You may be diagnosed based on your symptoms, medical history, and an exam. You may also have tests. These may include: Blood tests. Tests of your pee to check for an infection. Tests to check the flow of your pee. These are called urodynamic tests. You may also need to see an expert in problems with the bladder called a urologist. How is this treated? Treatment depends on the cause and how bad your symptoms are. It may include: Bladder training, such as: Learning to control the urge to pee by peeing at certain times. Doing Kegel exercises. These can help you strengthen the muscles that start and stop the flow of pee. Special devices, such as: Biofeedback. This uses sensors to help you learn when to pee. Electrical stimulation. This uses electricity to make the nerves and muscles of your bladder work. A pessary. This can be put in the vagina to support the bladder. Medicines to: Treat a UTI. Stop the bladder from releasing pee at the wrong time. Calm the bladder muscles. Surgery to: Help the nerve signals that control peeing. Change the shape of your bladder. This is done only in very bad cases. Follow these instructions at home: Eating and drinking  Make changes to your diet as told. You may need to: Drink small amounts of fluid during the day rather than large amounts at one time. Take in less caffeine or alcohol. Take steps to help treat or prevent trouble pooping. You may need to eat foods high in fiber, like beans, whole grains, and fresh fruits and vegetables. Lifestyle Lose weight if needed. Do not smoke, vape, or use nicotine or tobacco. General  instructions Take your medicines only as told. If you were given antibiotics, take them as told. Do not stop taking them even if you start to feel better. Use devices as told. If needed, wear pads to absorb pee leakage. Keep track of how much you drink, when you drink, and when you need to pee. Contact a health care provider if: Your symptoms don't get better with treatment. You can't control your bladder. You have a fever or chills. This information is not intended to replace advice given to you by your health care provider. Make sure you discuss any questions you have with your health care provider. Document Revised: 09/30/2022 Document Reviewed: 09/30/2022 Elsevier Patient Education  2024 Elsevier Inc.   If you have been instructed to have an in-person evaluation today at a local Urgent Care facility, please use the link below. It will take you to a list of all of  our available [facility], including address, phone number and hours of operation. Please do not delay care.  [facility]  If you or a family member do not have a primary care provider, use the link below to schedule a visit and establish care. When you choose a [facility] primary care physician or advanced practice provider, you gain a long-term partner in health. Find a Primary Care Provider  Learn more about [facility]'s in-office and virtual care options: [facility] - Get Care Now

## 2023-04-16 ENCOUNTER — Ambulatory Visit: Payer: Medicare PPO | Admitting: Student

## 2023-04-16 DIAGNOSIS — H53453 Other localized visual field defect, bilateral: Secondary | ICD-10-CM | POA: Diagnosis not present

## 2023-04-16 DIAGNOSIS — H2513 Age-related nuclear cataract, bilateral: Secondary | ICD-10-CM | POA: Diagnosis not present

## 2023-04-25 ENCOUNTER — Other Ambulatory Visit: Payer: Self-pay | Admitting: Physician Assistant

## 2023-04-25 DIAGNOSIS — N3281 Overactive bladder: Secondary | ICD-10-CM

## 2023-04-28 ENCOUNTER — Ambulatory Visit: Payer: Medicare PPO | Admitting: Family Medicine

## 2023-04-28 VITALS — BP 102/70 | HR 64 | Ht 63.0 in | Wt 172.0 lb

## 2023-04-28 DIAGNOSIS — F1010 Alcohol abuse, uncomplicated: Secondary | ICD-10-CM | POA: Diagnosis not present

## 2023-04-28 DIAGNOSIS — F411 Generalized anxiety disorder: Secondary | ICD-10-CM | POA: Diagnosis not present

## 2023-04-28 DIAGNOSIS — N3281 Overactive bladder: Secondary | ICD-10-CM | POA: Diagnosis not present

## 2023-04-28 DIAGNOSIS — F3341 Major depressive disorder, recurrent, in partial remission: Secondary | ICD-10-CM | POA: Diagnosis not present

## 2023-04-28 LAB — POCT URINALYSIS DIP (MANUAL ENTRY)
Bilirubin, UA: NEGATIVE
Blood, UA: NEGATIVE
Glucose, UA: NEGATIVE mg/dL
Ketones, POC UA: NEGATIVE mg/dL
Leukocytes, UA: NEGATIVE
Nitrite, UA: NEGATIVE
Protein Ur, POC: NEGATIVE mg/dL
Spec Grav, UA: <=1.005 — AB (ref 1.010–1.025)
Urobilinogen, UA: 0.2 U/dL
pH, UA: 5.5 (ref 5.0–8.0)

## 2023-04-28 MED ORDER — MIRABEGRON ER 25 MG PO TB24
25.0000 mg | ORAL_TABLET | Freq: Every day | ORAL | 0 refills | Status: DC
Start: 2023-04-28 — End: 2023-05-16

## 2023-04-28 NOTE — Progress Notes (Signed)
    SUBJECTIVE:   CHIEF COMPLAINT / HPI:   Had virtual visit on 04/08/23. Prescribed Macrobid  and Oxybutynin .  Often feeling the urge to urinate. That has improved. Still has some urge but does not necessarily physically need to pee. Ongoing for 3-4 weeks. Has not had accidents. Able to make it to bathroom. Took both medicine. Does pee a little with coughing or sneezing. No more burning with peeing.  Does endorse "dehydration" with taking oxybutynin .  PERTINENT  PMH / PSH: Anxiety, Depression, Anemia, HLD  OBJECTIVE:   BP 102/70   Pulse 64   Ht 5\' 3"  (1.6 m)   Wt 172 lb (78 kg)   SpO2 94%   BMI 30.47 kg/m   General: NAD, well appearing Neuro: A&O Respiratory: normal WOB on RA Extremities: Moving all 4 extremities equally  UA - nitrates, leukocytes  ASSESSMENT/PLAN:   Assessment & Plan Overactive bladder Mixed picture primarily urinary urgency with some stress incontinence. Doubt repeat UTI. Provided with pelvic floor exercises. Will start Myrbetriq  daily. Follow up 1 month for dose titration versus addition of Vesicare.   Precepted with Dr. Ardyce Bee  Return in about 1 month (around 05/26/2023).  Ivin Marrow, MD [facility]

## 2023-04-28 NOTE — Assessment & Plan Note (Addendum)
 Mixed picture primarily urinary urgency with some stress incontinence. Doubt repeat UTI. Provided with pelvic floor exercises. Will start Myrbetriq  daily. Follow up 1 month for dose titration versus addition of Vesicare.

## 2023-04-28 NOTE — Patient Instructions (Addendum)
 It was great to see you! Thank you for allowing me to participate in your care!  Our plans for today:  - Please start taking Myrbetriq  once daily this should help with your urinary urgency. - I have provided some exercises that may help you as well. - Please avoid caffeine and alcohol as these can also stimulate your bladder.   Please arrive 15 minutes PRIOR to your next scheduled appointment time! If you do not, this affects OTHER patients' care.  Take care and seek immediate care sooner if you develop any concerns.   Ivin Marrow, MD, PGY-2 [facility] 11:11 AM 04/28/2023  [facility]

## 2023-05-08 ENCOUNTER — Ambulatory Visit: Payer: Medicare PPO

## 2023-05-08 VITALS — Ht 63.0 in | Wt 172.0 lb

## 2023-05-08 DIAGNOSIS — Z0000 Encounter for general adult medical examination without abnormal findings: Secondary | ICD-10-CM

## 2023-05-08 NOTE — Patient Instructions (Signed)
 [name] , Thank you for taking time to come for your Medicare Wellness Visit. I appreciate your ongoing commitment to your health goals. Please review the following plan we discussed and let me know if I can assist you in the future.   Referrals/Orders/Follow-Ups/Clinician Recommendations: Yes; Keep maintaining your health by keeping your appointments with Dr.Maxwell and any specialists that you may see.  Call us if you need anything.  Have a great year!!!!  This is a list of the screening recommended for you and due dates:  Health Maintenance  Topic Date Due   Zoster (Shingles) Vaccine (1 of 2) Never done   Pneumonia Vaccine (1 of 1 - PCV) Never done   COVID-19 Vaccine (5 - 2024-25 season) 05/14/2023*   Medicare Annual Wellness Visit  05/07/2024   Mammogram  01/09/2025   DTaP/Tdap/Td vaccine (2 - Td or Tdap) 08/29/2026   Colon Cancer Screening  08/06/2027   Flu Shot  Completed   DEXA scan (bone density measurement)  Completed   Hepatitis C Screening  Completed   HPV Vaccine  Aged Out  *Topic was postponed. The date shown is not the original due date.    Advanced directives: (Provided) Advance directive discussed with you today. I have provided a copy for you to complete at home and have notarized. Once this is complete, please bring a copy in to our office so we can scan it into your chart.   Next Medicare Annual Wellness Visit scheduled for next year: Yes

## 2023-05-08 NOTE — Progress Notes (Signed)
 Subjective:   Sheryl Howell is a 68 y.o. who presents for a Medicare Wellness preventive visit.  Visit Complete: Virtual I connected with  Sheryl Howell on 05/08/23 by a audio enabled telemedicine application and verified that I am speaking with the correct person using two identifiers.  Patient Location: Home  Provider Location: Home Office  I discussed the limitations of evaluation and management by telemedicine. The patient expressed understanding and agreed to proceed.  Vital Signs: Because this visit was a virtual/telehealth visit, some criteria may be missing or patient reported. Any vitals not documented were not able to be obtained and vitals that have been documented are patient reported.  VideoDeclined- This patient declined Librarian, academic. Therefore the visit was completed with audio only.  AWV Questionnaire: No: Patient Medicare AWV questionnaire was not completed prior to this visit.  Cardiac Risk Factors include: advanced age (>34men, >65 women);family history of premature cardiovascular disease;obesity (BMI >30kg/m2);sedentary lifestyle     Objective:    Today's Vitals   05/08/23 1327  Weight: 172 lb (78 kg)  Height: 5\' 3"  (1.6 m)  PainSc: 0-No pain   Body mass index is 30.47 kg/m.     05/08/2023    1:31 PM 04/28/2023   10:47 AM 10/02/2022    8:53 AM 04/12/2022    4:16 PM 06/21/2020    3:50 PM 11/18/2019    8:58 AM 01/01/2018    2:04 PM  Advanced Directives  Does Patient Have a Medical Advance Directive? No No Yes No No No No  Type of Advance Directive   Living will      Would patient like information on creating a medical advance directive? No - Patient declined No - Patient declined  No - Patient declined No - Patient declined No - Patient declined No - Patient declined    Current Medications (verified) Outpatient Encounter Medications as of 05/08/2023  Medication Sig   buPROPion (WELLBUTRIN XL) 300 MG  24 hr tablet Take 300 mg by mouth in the morning.   busPIRone (BUSPAR) 5 MG tablet Take 5 mg by mouth 2 (two) times daily.   Cholecalciferol (VITAMIN D-3 PO) Take 1 tablet by mouth every evening.   citalopram (CELEXA) 20 MG tablet TAKE 1 TABLET BY MOUTH EVERY DAY (Patient taking differently: Take 20 mg by mouth every evening.)   Cyanocobalamin (VITAMIN B-12 PO) Take 1 tablet by mouth in the morning.   mirabegron ER (MYRBETRIQ) 25 MG TB24 tablet Take 1 tablet (25 mg total) by mouth daily.   [DISCONTINUED] HYDROcodone-acetaminophen (NORCO/VICODIN) 5-325 MG tablet Take 1 tablet by mouth every 4 (four) hours as needed.   [DISCONTINUED] loratadine (CLARITIN) 10 MG tablet Take 10 mg by mouth every evening.   [DISCONTINUED] nitrofurantoin, macrocrystal-monohydrate, (MACROBID) 100 MG capsule Take 1 capsule (100 mg total) by mouth 2 (two) times daily.   [DISCONTINUED] ondansetron (ZOFRAN) 4 MG tablet Take 1 tablet (4 mg total) by mouth every 8 (eight) hours as needed for nausea or vomiting.   No facility-administered encounter medications on file as of 05/08/2023.    Allergies (verified) Sulfa antibiotics   History: Past Medical History:  Diagnosis Date   Adverse effect of anesthetic    bp low-takes awhile to come up post op   Anemia    Anemia 04/09/2012   Hx of anemia   Anxiety    Arthritis    Cancer (HCC)    squamous cell skin cancer   Cholelithiasis and acute cholecystitis with obstruction 04/09/2012  Depression    Hepatitis B    9/13-dx hep b, HB S ab + 04/07/2012   Hx of anxiety disorder 04/09/2012   Hx of seizure disorder 04/09/2012   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   Hypotension    Nausea    PONV (postoperative nausea and vomiting)    Seizures (HCC)    related to BP dropping per patient   Past Surgical History:  Procedure Laterality Date   BUNIONECTOMY     both feet   CHOLECYSTECTOMY  04/08/2012   Procedure: LAPAROSCOPIC CHOLECYSTECTOMY WITH INTRAOPERATIVE  CHOLANGIOGRAM;  Surgeon: Emelia Loron, MD;  Location: [facility];  Service: General;  Laterality: Right;  C-  Arm   COLONOSCOPY     DIAGNOSTIC LAPAROSCOPY  03/18/1990   exp lap fertility   ERCP  04/09/2012   Procedure: ENDOSCOPIC RETROGRADE CHOLANGIOPANCREATOGRAPHY (ERCP);  Surgeon: Iva Boop, MD;  Location: [facility];  Service: Endoscopy;  Laterality: N/A;   FOOT SURGERY     KNEE SURGERY Right    ACL repair   ORIF ANKLE FRACTURE Right 10/02/2022   Procedure: OPEN REDUCTION INTERNAL FIXATION (ORIF) RIGHT ANKLE FRACTURE;  Surgeon: Nadara Mustard, MD;  Location: [facility];  Service: Orthopedics;  Laterality: Right;   WRIST SURGERY Left    ganglion cyst removed   Family History  Problem Relation Age of Onset   Heart disease Other    Cancer Sister        Uterine   Cancer Maternal Uncle    Cancer Paternal Aunt        Breast   Breast cancer Maternal Aunt    Social History   Socioeconomic History   Marital status: Married    Spouse name: Not on file   Number of children: Not on file   Years of education: Not on file   Highest education level: Not on file  Occupational History   Occupation: Retired   Occupation: Part-time  Tobacco Use   Smoking status: Never   Smokeless tobacco: Never  Vaping Use   Vaping status: Never Used  Substance and Sexual Activity   Alcohol use: Yes    Alcohol/week: 7.0 standard drinks of alcohol    Types: 7 Glasses of wine per week    Comment: glasses white wine daily   Drug use: No   Sexual activity: Not on file  Other Topics Concern   Not on file  Social History Narrative   Not on file   Social Drivers of Health   Financial Resource Strain: Low Risk  (05/08/2023)   Overall Financial Resource Strain (CARDIA)    Difficulty of Paying Living Expenses: Not hard at all  Food Insecurity: No Food Insecurity (05/08/2023)   Hunger Vital Sign    Worried About Running Out of Food in the Last Year: Never true    Ran Out of Food in the Last Year:  Never true  Transportation Needs: No Transportation Needs (05/08/2023)   PRAPARE - Administrator, Civil Service (Medical): No    Lack of Transportation (Non-Medical): No  Physical Activity: Inactive (05/08/2023)   Exercise Vital Sign    Days of Exercise per Week: 0 days    Minutes of Exercise per Session: 0 min  Stress: No Stress Concern Present (05/08/2023)   [employer] of Occupational Health - Occupational Stress Questionnaire    Feeling of Stress : Not at all  Social Connections: Moderately Integrated (05/08/2023)   Social Connection and Isolation Panel [NHANES]  Frequency of Communication with Friends and Family: More than three times a week    Frequency of Social Gatherings with Friends and Family: Once a week    Attends Religious Services: Never    Diplomatic Services operational officer: No    Attends Engineer, structural: More than 4 times per year    Marital Status: Married    Tobacco Counseling Counseling given: Not Answered    Clinical Intake:  Pre-visit preparation completed: Yes  Pain : No/denies pain Pain Score: 0-No pain     BMI - recorded: 30.47 Nutritional Status: BMI > 30  Obese Nutritional Risks: None Diabetes: No  How often do you need to have someone help you when you read instructions, pamphlets, or other written materials from your doctor or pharmacy?: 1 - Never What is the last grade level you completed in school?: MASTER'S DEGREE  Interpreter Needed?: No  Information entered by :: Missey Hasley N. Kadir Azucena, LPN.   Activities of Daily Living     05/08/2023    1:33 PM 10/02/2022    9:00 AM  In your present state of health, do you have any difficulty performing the following activities:  Hearing? 0   Vision? 0   Difficulty concentrating or making decisions? 0   Walking or climbing stairs? 0   Dressing or bathing? 0   Doing errands, shopping? 0 1  Comment  d/t ankle fracture  Preparing Food and eating ? N   Using  the Toilet? N   In the past six months, have you accidently leaked urine? Y   Comment overactive bladder; even with sneezing & coughing   Do you have problems with loss of bowel control? N   Managing your Medications? N   Managing your Finances? N   Housekeeping or managing your Housekeeping? N     Patient Care Team: Alfredo Martinez, MD as PCP - General (Family Medicine) Mia Creek, MD as Consulting Physician (Ophthalmology)  Indicate any recent Medical Services you may have received from other than [facility] providers in the past year (date may be approximate).     Assessment:   This is a routine wellness examination for [facility].  Hearing/Vision screen Hearing Screening - Comments:: Patient has some hearing loss. No hearing aids.  Vision Screening - Comments:: Wears rx glasses - up to date with routine eye exams with [facility] Cataract surgery consult: June 24, 2023 at 10:00 a.m. with Dr. Mia Creek    Goals Addressed             This Visit's Progress    My healthcare goals for 2025 is to become more physically active.  I would love to try chair yoga.         Depression Screen     05/08/2023    1:33 PM 04/28/2023   10:46 AM 04/26/2022    1:10 PM 04/12/2022    4:16 PM 12/14/2020    2:56 PM 06/21/2020    3:54 PM 11/18/2019    8:59 AM  PHQ 2/9 Scores  PHQ - 2 Score 0 0 2 4 6 2  0  PHQ- 9 Score 0 0 4 10 15 11  0    Fall Risk     05/08/2023    1:33 PM 04/28/2023   10:46 AM 04/26/2022    1:12 PM 04/12/2022    4:16 PM 01/25/2014    3:49 PM  Fall Risk   Falls in the past year? 1 0 0 1 No  Number falls in past yr: 0 0 0 0   Injury with Fall? 1 0 0    Comment 09/26/2022; broken ankle      Risk for fall due to :   No Fall Risks    Follow up Falls prevention discussed;Falls evaluation completed  Falls evaluation completed      MEDICARE RISK AT HOME:  Medicare Risk at Home Any stairs in or around the home?: Yes If so, are there any without handrails?: No Home  free of loose throw rugs in walkways, pet beds, electrical cords, etc?: Yes Adequate lighting in your home to reduce risk of falls?: Yes Life alert?: No Use of a cane, walker or w/c?: No Grab bars in the bathroom?: Yes Shower chair or bench in shower?: Yes Elevated toilet seat or a handicapped toilet?: No  TIMED UP AND GO:  Was the test performed?  No  Cognitive Function: 6CIT completed    05/08/2023    1:34 PM  MMSE - Mini Mental State Exam  Not completed: Unable to complete        05/08/2023    1:34 PM 04/26/2022    1:13 PM  6CIT Screen  What Year? 0 points 0 points  What month? 0 points 0 points  What time? 0 points 0 points  Count back from 20 0 points 0 points  Months in reverse 0 points 0 points  Repeat phrase 0 points 0 points  Total Score 0 points 0 points    Immunizations Immunization History  Administered Date(s) Administered   Fluad Quad(high Dose 65+) 12/14/2020   Influenza, Quadrivalent, Recombinant, Inj, Pf 01/03/2019   Influenza,inj,Quad PF,6+ Mos 12/27/2013, 12/05/2016, 01/01/2018   Influenza-Unspecified 12/16/2012, 01/20/2020, 11/23/2021, 12/29/2022   PFIZER(Purple Top)SARS-COV-2 Vaccination 05/22/2019, 06/12/2019, 03/02/2020   Pfizer Covid-19 Vaccine Bivalent Booster 64yrs & up 12/19/2021   Tdap 08/28/2016    Screening Tests Health Maintenance  Topic Date Due   Zoster Vaccines- Shingrix (1 of 2) Never done   Pneumonia Vaccine 79+ Years old (1 of 1 - PCV) Never done   COVID-19 Vaccine (5 - 2024-25 season) 05/14/2023 (Originally 11/17/2022)   Medicare Annual Wellness (AWV)  05/07/2024   MAMMOGRAM  01/09/2025   DTaP/Tdap/Td (2 - Td or Tdap) 08/29/2026   Colonoscopy  08/06/2027   INFLUENZA VACCINE  Completed   DEXA SCAN  Completed   Hepatitis C Screening  Completed   HPV VACCINES  Aged Out    Health Maintenance  Health Maintenance Due  Topic Date Due   Zoster Vaccines- Shingrix (1 of 2) Never done   Pneumonia Vaccine 24+ Years old (1 of 1 -  PCV) Never done   Health Maintenance Items Addressed: Patient declined Pneumonia vaccine.  Patient stated that she has had Shingrix vaccine, no record in [place]  Additional Screening:  Vision Screening: Recommended annual ophthalmology exams for early detection of glaucoma and other disorders of the eye.  Dental Screening: Recommended annual dental exams for proper oral hygiene  Community Resource Referral / Chronic Care Management: CRR required this visit?  No   CCM required this visit?  No     Plan:     I have personally reviewed and noted the following in the patient's chart:   Medical and social history Use of alcohol, tobacco or illicit drugs  Current medications and supplements including opioid prescriptions. Patient is not currently taking opioid prescriptions. Functional ability and status Nutritional status Physical activity Advanced directives List of other physicians Hospitalizations, surgeries, and ER visits  in previous 12 months Vitals Screenings to include cognitive, depression, and falls Referrals and appointments  In addition, I have reviewed and discussed with patient certain preventive protocols, quality metrics, and best practice recommendations. A written personalized care plan for preventive services as well as general preventive health recommendations were provided to patient.     Mickeal Needy, LPN   11/30/7827   After Visit Summary: (MyChart) Due to this being a telephonic visit, the after visit summary with patients personalized plan was offered to patient via MyChart   Notes: Nothing significant to report at this time.

## 2023-05-09 IMAGING — MG MM DIGITAL SCREENING BILAT W/ TOMO AND CAD
8 series · 8 of 24 positions shown · non-contrast
Comparison: Previous exam(s).

CLINICAL DATA: Screening.

EXAM:
DIGITAL SCREENING BILATERAL MAMMOGRAM WITH TOMOSYNTHESIS AND CAD
TECHNIQUE: Bilateral screening digital craniocaudal and mediolateral oblique
mammograms were obtained. Bilateral screening digital breast
tomosynthesis was performed. The images were evaluated with
computer-aided detection.

[R MLO synth-2D]
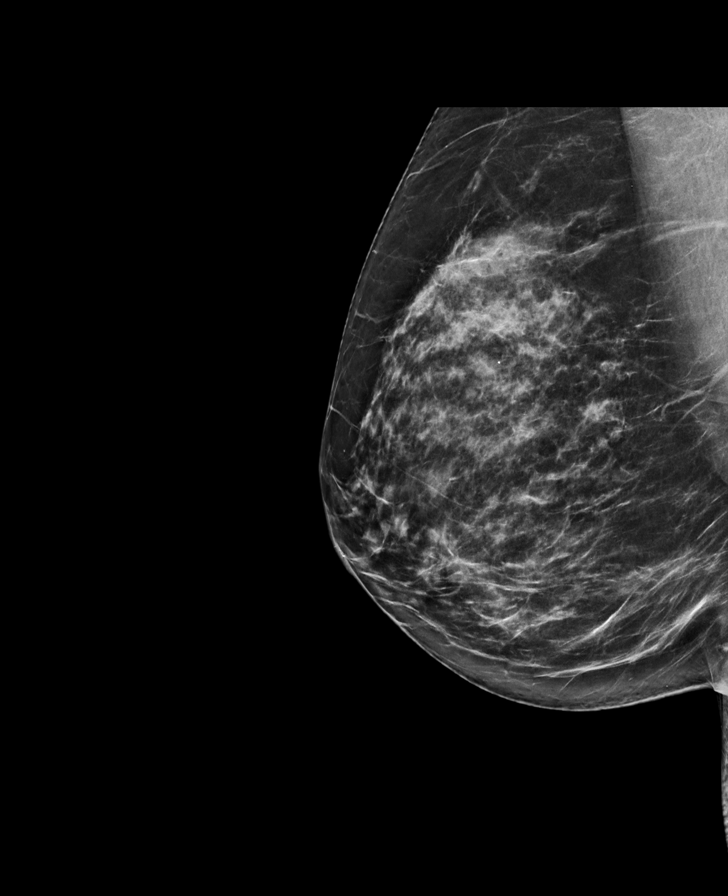

[R CC synth-2D]
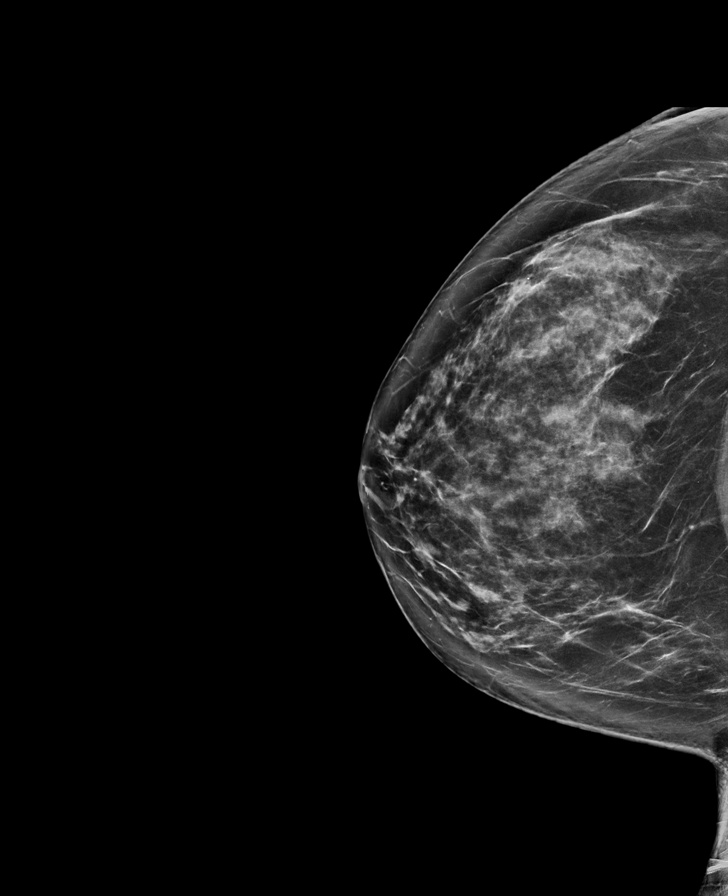

[L CC synth-2D]
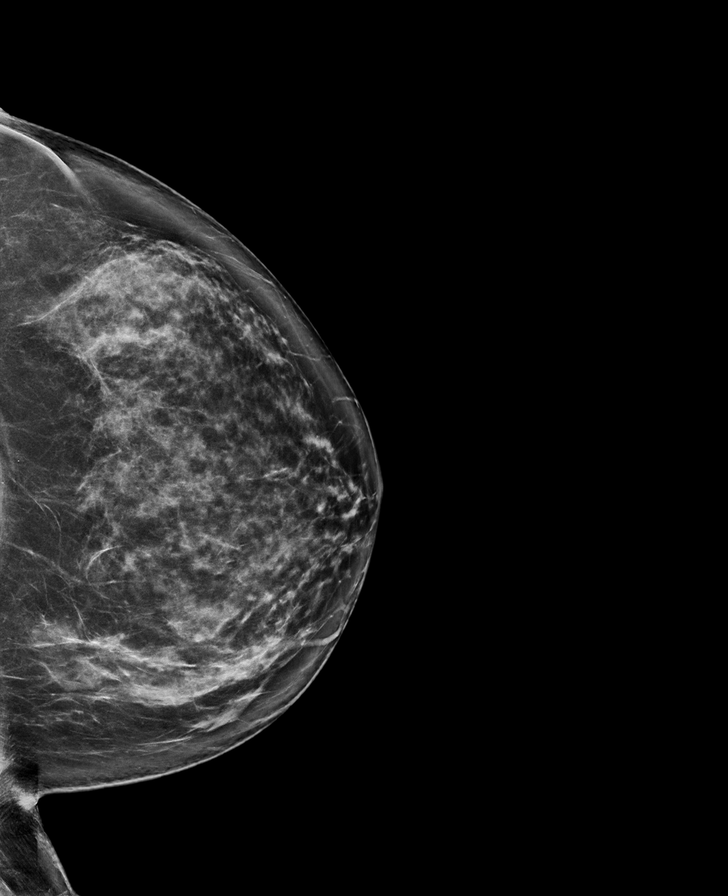

[L MLO synth-2D]
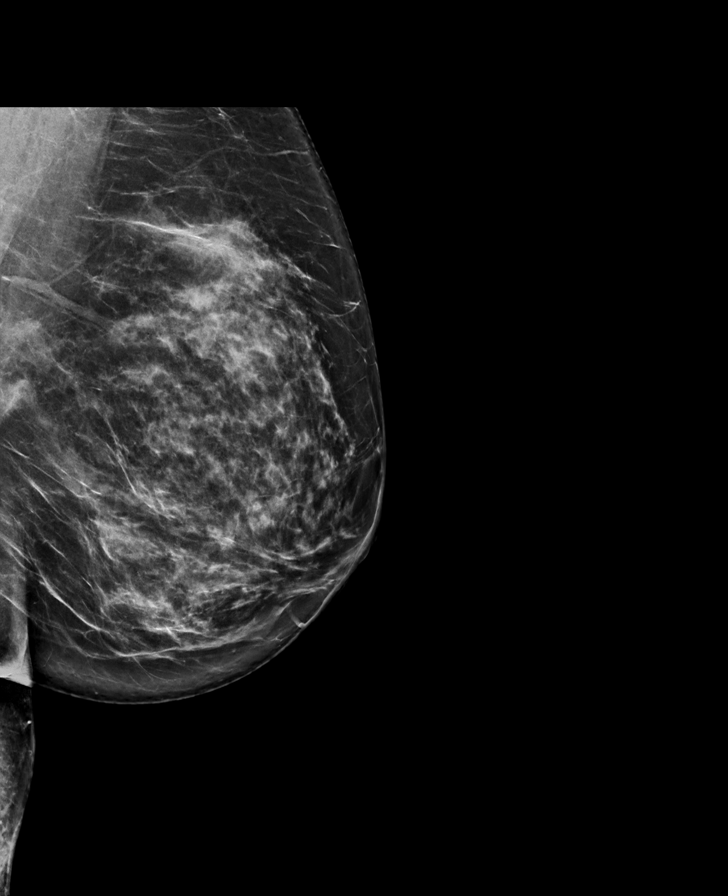

[L MLO tomo · tomo slice 43/86.0]
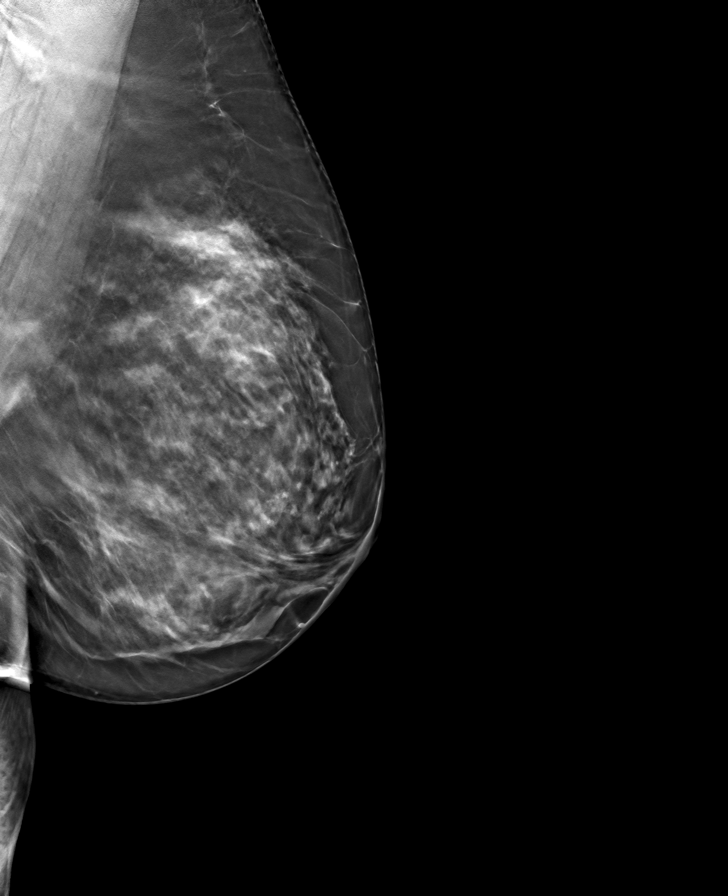

[L CC tomo · tomo slice 43/86.0]
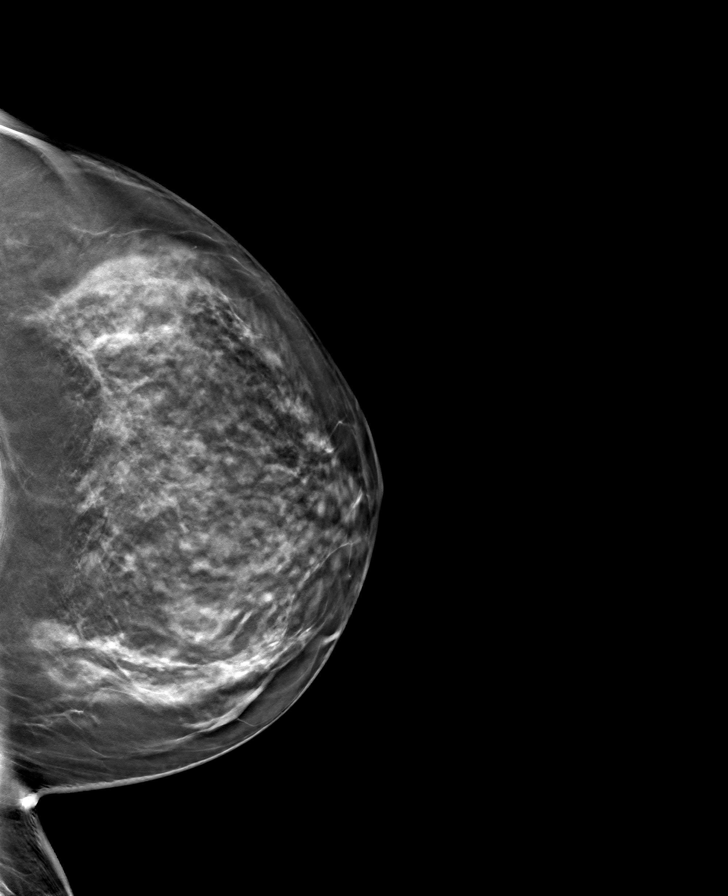

[R MLO tomo · tomo slice 39/78.0]
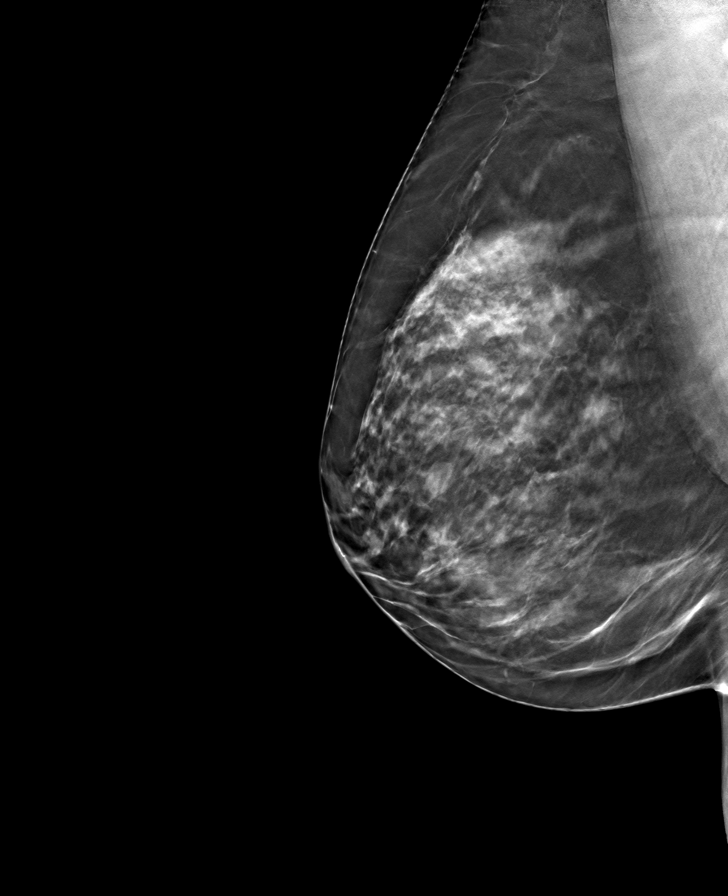

[R CC tomo · tomo slice 44/87.0]
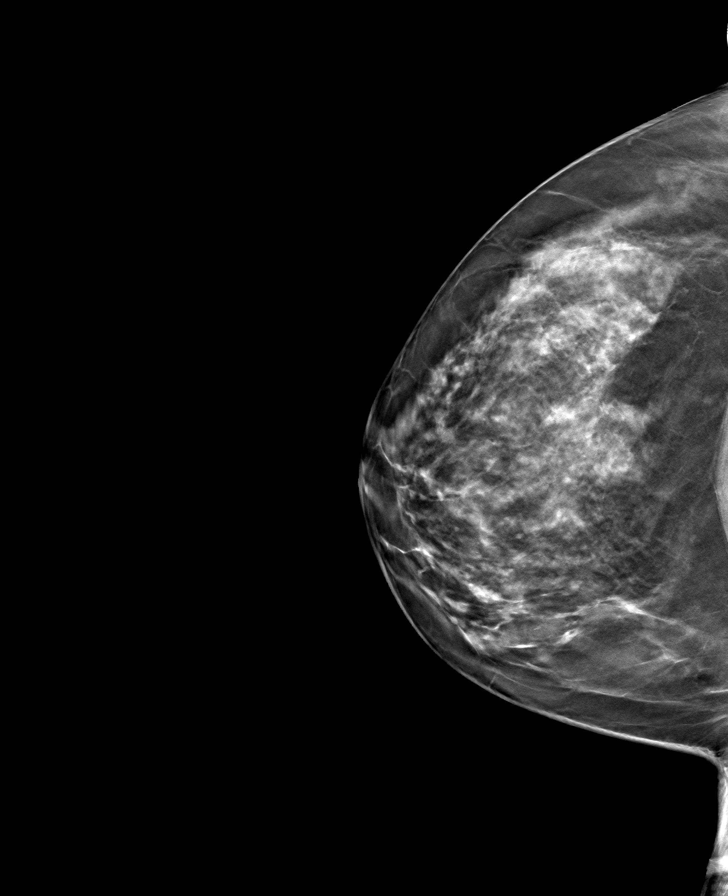

[8 of 24 positions shown; findings below may reference images not displayed]

ACR Breast Density Category c: The breast tissue is heterogeneously
dense, which may obscure small masses.
FINDINGS: There are no findings suspicious for malignancy.
IMPRESSION: No mammographic evidence of malignancy. A result letter of this
screening mammogram will be mailed directly to the patient.

RECOMMENDATION:
Screening mammogram in one year. (Code:Q3-W-BC3)

BI-RADS CATEGORY  1: Negative.

## 2023-05-16 ENCOUNTER — Encounter: Payer: Self-pay | Admitting: Family Medicine

## 2023-05-16 ENCOUNTER — Ambulatory Visit: Payer: Medicare PPO | Admitting: Family Medicine

## 2023-05-16 DIAGNOSIS — N3281 Overactive bladder: Secondary | ICD-10-CM | POA: Diagnosis not present

## 2023-05-16 MED ORDER — MIRABEGRON ER 25 MG PO TB24
25.0000 mg | ORAL_TABLET | Freq: Every day | ORAL | 2 refills | Status: DC
Start: 2023-05-16 — End: 2023-08-23

## 2023-05-16 NOTE — Progress Notes (Signed)
    SUBJECTIVE:   CHIEF COMPLAINT / HPI: overactive bladder f/u  Myrbetriq has helped. Decreased her urinary urge. No burning or dyuria. No urge is every once and a while. Is doing well.  Is using pads still. Has not had urine leakage. Would like to be on as little medications as possible.  PERTINENT  PMH / PSH: Urinary Urge   OBJECTIVE:   BP 115/80   Pulse 80   Ht 5\' 3"  (1.6 m)   Wt 173 lb 3.2 oz (78.6 kg)   SpO2 99%   BMI 30.68 kg/m   General: NAD, well appearing Neuro: A&O Respiratory: normal WOB on RA Extremities: Moving all 4 extremities equally   ASSESSMENT/PLAN:   Assessment & Plan Overactive bladder Continue current management with Myrbetrig 25mg  daily. Refilled for 3 months. Messaged patient in mychart asking if she would like referral for pelvic floor therapy.  Return if symptoms worsen or fail to improve.  Celine Mans, MD [facility]

## 2023-05-16 NOTE — Patient Instructions (Signed)
 It was great to see you! Thank you for allowing me to participate in your care!  Our plans for today:  - I have refilled your Myrbetriq. - Let me know if you feel like you need to increase your dose.   Please arrive 15 minutes PRIOR to your next scheduled appointment time! If you do not, this affects OTHER patients' care.  Take care and seek immediate care sooner if you develop any concerns.   Celine Mans, MD, PGY-2 [facility] 11:40 AM 05/16/2023  [facility]

## 2023-05-16 NOTE — Assessment & Plan Note (Signed)
 Continue current management with Myrbetrig 25mg  daily. Refilled for 3 months. Messaged patient in mychart asking if she would like referral for pelvic floor therapy.

## 2023-05-19 DIAGNOSIS — F3341 Major depressive disorder, recurrent, in partial remission: Secondary | ICD-10-CM | POA: Diagnosis not present

## 2023-05-19 DIAGNOSIS — F1090 Alcohol use, unspecified, uncomplicated: Secondary | ICD-10-CM | POA: Diagnosis not present

## 2023-05-19 DIAGNOSIS — F411 Generalized anxiety disorder: Secondary | ICD-10-CM | POA: Diagnosis not present

## 2023-05-29 DIAGNOSIS — F341 Dysthymic disorder: Secondary | ICD-10-CM | POA: Diagnosis not present

## 2023-05-29 DIAGNOSIS — F411 Generalized anxiety disorder: Secondary | ICD-10-CM | POA: Diagnosis not present

## 2023-06-09 DIAGNOSIS — F3341 Major depressive disorder, recurrent, in partial remission: Secondary | ICD-10-CM | POA: Diagnosis not present

## 2023-06-09 DIAGNOSIS — F1090 Alcohol use, unspecified, uncomplicated: Secondary | ICD-10-CM | POA: Diagnosis not present

## 2023-06-09 DIAGNOSIS — F411 Generalized anxiety disorder: Secondary | ICD-10-CM | POA: Diagnosis not present

## 2023-06-24 DIAGNOSIS — H2513 Age-related nuclear cataract, bilateral: Secondary | ICD-10-CM | POA: Diagnosis not present

## 2023-06-24 DIAGNOSIS — H18413 Arcus senilis, bilateral: Secondary | ICD-10-CM | POA: Diagnosis not present

## 2023-06-24 DIAGNOSIS — H25043 Posterior subcapsular polar age-related cataract, bilateral: Secondary | ICD-10-CM | POA: Diagnosis not present

## 2023-06-24 DIAGNOSIS — H2511 Age-related nuclear cataract, right eye: Secondary | ICD-10-CM | POA: Diagnosis not present

## 2023-06-24 DIAGNOSIS — H25013 Cortical age-related cataract, bilateral: Secondary | ICD-10-CM | POA: Diagnosis not present

## 2023-06-30 DIAGNOSIS — F1090 Alcohol use, unspecified, uncomplicated: Secondary | ICD-10-CM | POA: Diagnosis not present

## 2023-06-30 DIAGNOSIS — F3341 Major depressive disorder, recurrent, in partial remission: Secondary | ICD-10-CM | POA: Diagnosis not present

## 2023-06-30 DIAGNOSIS — F411 Generalized anxiety disorder: Secondary | ICD-10-CM | POA: Diagnosis not present

## 2023-07-21 DIAGNOSIS — F1090 Alcohol use, unspecified, uncomplicated: Secondary | ICD-10-CM | POA: Diagnosis not present

## 2023-07-21 DIAGNOSIS — F411 Generalized anxiety disorder: Secondary | ICD-10-CM | POA: Diagnosis not present

## 2023-07-21 DIAGNOSIS — F341 Dysthymic disorder: Secondary | ICD-10-CM | POA: Diagnosis not present

## 2023-07-23 DIAGNOSIS — H2511 Age-related nuclear cataract, right eye: Secondary | ICD-10-CM | POA: Diagnosis not present

## 2023-07-24 DIAGNOSIS — H2512 Age-related nuclear cataract, left eye: Secondary | ICD-10-CM | POA: Diagnosis not present

## 2023-07-24 DIAGNOSIS — F341 Dysthymic disorder: Secondary | ICD-10-CM | POA: Diagnosis not present

## 2023-07-24 DIAGNOSIS — F411 Generalized anxiety disorder: Secondary | ICD-10-CM | POA: Diagnosis not present

## 2023-08-11 DIAGNOSIS — F1010 Alcohol abuse, uncomplicated: Secondary | ICD-10-CM | POA: Diagnosis not present

## 2023-08-11 DIAGNOSIS — F3341 Major depressive disorder, recurrent, in partial remission: Secondary | ICD-10-CM | POA: Diagnosis not present

## 2023-08-11 DIAGNOSIS — F411 Generalized anxiety disorder: Secondary | ICD-10-CM | POA: Diagnosis not present

## 2023-08-13 DIAGNOSIS — H2512 Age-related nuclear cataract, left eye: Secondary | ICD-10-CM | POA: Diagnosis not present

## 2023-08-14 DIAGNOSIS — H2512 Age-related nuclear cataract, left eye: Secondary | ICD-10-CM | POA: Diagnosis not present

## 2023-08-21 DIAGNOSIS — F411 Generalized anxiety disorder: Secondary | ICD-10-CM | POA: Diagnosis not present

## 2023-08-21 DIAGNOSIS — F341 Dysthymic disorder: Secondary | ICD-10-CM | POA: Diagnosis not present

## 2023-08-22 ENCOUNTER — Other Ambulatory Visit: Payer: Self-pay | Admitting: Family Medicine

## 2023-08-22 DIAGNOSIS — N3281 Overactive bladder: Secondary | ICD-10-CM

## 2023-08-26 ENCOUNTER — Encounter: Payer: Self-pay | Admitting: *Deleted

## 2023-09-08 DIAGNOSIS — F1090 Alcohol use, unspecified, uncomplicated: Secondary | ICD-10-CM | POA: Diagnosis not present

## 2023-09-08 DIAGNOSIS — F411 Generalized anxiety disorder: Secondary | ICD-10-CM | POA: Diagnosis not present

## 2023-09-08 DIAGNOSIS — F3341 Major depressive disorder, recurrent, in partial remission: Secondary | ICD-10-CM | POA: Diagnosis not present

## 2023-09-11 DIAGNOSIS — H5211 Myopia, right eye: Secondary | ICD-10-CM | POA: Diagnosis not present

## 2023-09-12 DIAGNOSIS — L218 Other seborrheic dermatitis: Secondary | ICD-10-CM | POA: Diagnosis not present

## 2023-09-12 DIAGNOSIS — L821 Other seborrheic keratosis: Secondary | ICD-10-CM | POA: Diagnosis not present

## 2023-09-12 DIAGNOSIS — L814 Other melanin hyperpigmentation: Secondary | ICD-10-CM | POA: Diagnosis not present

## 2023-09-12 DIAGNOSIS — L738 Other specified follicular disorders: Secondary | ICD-10-CM | POA: Diagnosis not present

## 2023-09-12 DIAGNOSIS — L739 Follicular disorder, unspecified: Secondary | ICD-10-CM | POA: Diagnosis not present

## 2023-09-12 DIAGNOSIS — D225 Melanocytic nevi of trunk: Secondary | ICD-10-CM | POA: Diagnosis not present

## 2023-10-06 DIAGNOSIS — F1090 Alcohol use, unspecified, uncomplicated: Secondary | ICD-10-CM | POA: Diagnosis not present

## 2023-10-06 DIAGNOSIS — F411 Generalized anxiety disorder: Secondary | ICD-10-CM | POA: Diagnosis not present

## 2023-10-06 DIAGNOSIS — F3341 Major depressive disorder, recurrent, in partial remission: Secondary | ICD-10-CM | POA: Diagnosis not present

## 2023-10-27 DIAGNOSIS — F1090 Alcohol use, unspecified, uncomplicated: Secondary | ICD-10-CM | POA: Diagnosis not present

## 2023-10-27 DIAGNOSIS — F3341 Major depressive disorder, recurrent, in partial remission: Secondary | ICD-10-CM | POA: Diagnosis not present

## 2023-10-27 DIAGNOSIS — F411 Generalized anxiety disorder: Secondary | ICD-10-CM | POA: Diagnosis not present

## 2023-11-11 DIAGNOSIS — F341 Dysthymic disorder: Secondary | ICD-10-CM | POA: Diagnosis not present

## 2023-11-11 DIAGNOSIS — F411 Generalized anxiety disorder: Secondary | ICD-10-CM | POA: Diagnosis not present

## 2023-11-14 ENCOUNTER — Telehealth: Admitting: Physician Assistant

## 2023-11-14 DIAGNOSIS — J019 Acute sinusitis, unspecified: Secondary | ICD-10-CM | POA: Diagnosis not present

## 2023-11-14 DIAGNOSIS — B9689 Other specified bacterial agents as the cause of diseases classified elsewhere: Secondary | ICD-10-CM | POA: Diagnosis not present

## 2023-11-14 MED ORDER — AMOXICILLIN-POT CLAVULANATE 875-125 MG PO TABS: 1.0000 | ORAL_TABLET | Freq: Two times a day (BID) | ORAL | 0 refills | Status: DC

## 2023-11-14 NOTE — Progress Notes (Signed)

## 2023-11-17 DIAGNOSIS — F1090 Alcohol use, unspecified, uncomplicated: Secondary | ICD-10-CM | POA: Diagnosis not present

## 2023-11-17 DIAGNOSIS — F3341 Major depressive disorder, recurrent, in partial remission: Secondary | ICD-10-CM | POA: Diagnosis not present

## 2023-11-17 DIAGNOSIS — F411 Generalized anxiety disorder: Secondary | ICD-10-CM | POA: Diagnosis not present

## 2023-11-27 ENCOUNTER — Encounter: Payer: Self-pay | Admitting: Family Medicine

## 2023-11-27 ENCOUNTER — Other Ambulatory Visit: Payer: Self-pay

## 2023-11-27 DIAGNOSIS — N3281 Overactive bladder: Secondary | ICD-10-CM

## 2023-11-27 MED ORDER — MIRABEGRON ER 25 MG PO TB24: 25.0000 mg | ORAL_TABLET | Freq: Every day | ORAL | 0 refills | Status: DC

## 2023-11-27 NOTE — Telephone Encounter (Signed)
 Chart reviewed  -Fairy Amy, MD

## 2023-12-08 DIAGNOSIS — F1010 Alcohol abuse, uncomplicated: Secondary | ICD-10-CM | POA: Diagnosis not present

## 2023-12-08 DIAGNOSIS — F330 Major depressive disorder, recurrent, mild: Secondary | ICD-10-CM | POA: Diagnosis not present

## 2023-12-08 DIAGNOSIS — F411 Generalized anxiety disorder: Secondary | ICD-10-CM | POA: Diagnosis not present

## 2023-12-26 ENCOUNTER — Ambulatory Visit

## 2023-12-26 ENCOUNTER — Other Ambulatory Visit: Payer: Self-pay

## 2023-12-26 DIAGNOSIS — Z1231 Encounter for screening mammogram for malignant neoplasm of breast: Secondary | ICD-10-CM

## 2023-12-29 DIAGNOSIS — F1010 Alcohol abuse, uncomplicated: Secondary | ICD-10-CM | POA: Diagnosis not present

## 2023-12-29 DIAGNOSIS — F330 Major depressive disorder, recurrent, mild: Secondary | ICD-10-CM | POA: Diagnosis not present

## 2023-12-29 DIAGNOSIS — F411 Generalized anxiety disorder: Secondary | ICD-10-CM | POA: Diagnosis not present

## 2024-01-12 ENCOUNTER — Ambulatory Visit

## 2024-01-16 ENCOUNTER — Ambulatory Visit
Admission: RE | Admit: 2024-01-16 | Discharge: 2024-01-16 | Disposition: A | Discharge status: Home / Self Care | Source: Ambulatory Visit | Attending: Family Medicine | Admitting: Family Medicine

## 2024-01-16 DIAGNOSIS — Z1231 Encounter for screening mammogram for malignant neoplasm of breast: Secondary | ICD-10-CM | POA: Diagnosis not present

## 2024-01-19 DIAGNOSIS — F1090 Alcohol use, unspecified, uncomplicated: Secondary | ICD-10-CM | POA: Diagnosis not present

## 2024-01-19 DIAGNOSIS — F411 Generalized anxiety disorder: Secondary | ICD-10-CM | POA: Diagnosis not present

## 2024-01-19 DIAGNOSIS — F3341 Major depressive disorder, recurrent, in partial remission: Secondary | ICD-10-CM | POA: Diagnosis not present

## 2024-02-03 DIAGNOSIS — F411 Generalized anxiety disorder: Secondary | ICD-10-CM | POA: Diagnosis not present

## 2024-02-03 DIAGNOSIS — F341 Dysthymic disorder: Secondary | ICD-10-CM | POA: Diagnosis not present

## 2024-02-16 DIAGNOSIS — F411 Generalized anxiety disorder: Secondary | ICD-10-CM | POA: Diagnosis not present

## 2024-02-16 DIAGNOSIS — F3341 Major depressive disorder, recurrent, in partial remission: Secondary | ICD-10-CM | POA: Diagnosis not present

## 2024-02-16 DIAGNOSIS — F1010 Alcohol abuse, uncomplicated: Secondary | ICD-10-CM | POA: Diagnosis not present

## 2024-03-03 ENCOUNTER — Other Ambulatory Visit: Payer: Self-pay

## 2024-03-03 DIAGNOSIS — N3281 Overactive bladder: Secondary | ICD-10-CM

## 2024-03-05 NOTE — Telephone Encounter (Signed)
 Chart reviewed  -Fairy Amy, MD

## 2024-04-04 ENCOUNTER — Other Ambulatory Visit: Payer: Self-pay

## 2024-04-04 ENCOUNTER — Emergency Department (HOSPITAL_BASED_OUTPATIENT_CLINIC_OR_DEPARTMENT_OTHER): Admission: EM | Admit: 2024-04-04 | Discharge: 2024-04-04 | Disposition: A | Discharge status: Home / Self Care

## 2024-04-04 ENCOUNTER — Encounter (HOSPITAL_BASED_OUTPATIENT_CLINIC_OR_DEPARTMENT_OTHER): Payer: Self-pay

## 2024-04-04 ENCOUNTER — Emergency Department (HOSPITAL_BASED_OUTPATIENT_CLINIC_OR_DEPARTMENT_OTHER)

## 2024-04-04 ENCOUNTER — Telehealth: Admitting: Family

## 2024-04-04 DIAGNOSIS — K5792 Diverticulitis of intestine, part unspecified, without perforation or abscess without bleeding: Secondary | ICD-10-CM | POA: Diagnosis not present

## 2024-04-04 DIAGNOSIS — R1032 Left lower quadrant pain: Secondary | ICD-10-CM | POA: Diagnosis present

## 2024-04-04 LAB — COMPREHENSIVE METABOLIC PANEL WITH GFR
ALT: 14 U/L (ref 0–44)
AST: 17 U/L (ref 15–41)
Albumin: 4.3 g/dL (ref 3.5–5.0)
Alkaline Phosphatase: 128 U/L — ABNORMAL HIGH (ref 38–126)
Anion gap: 14 (ref 5–15)
BUN: 9 mg/dL (ref 8–23)
CO2: 22 mmol/L (ref 22–32)
Calcium: 9.9 mg/dL (ref 8.9–10.3)
Chloride: 99 mmol/L (ref 98–111)
Creatinine, Ser: 0.94 mg/dL (ref 0.44–1.00)
GFR, Estimated: >60 mL/min
Glucose, Bld: 109 mg/dL — ABNORMAL HIGH (ref 70–99)
Potassium: 4 mmol/L (ref 3.5–5.1)
Sodium: 135 mmol/L (ref 135–145)
Total Bilirubin: 0.7 mg/dL (ref 0.0–1.2)
Total Protein: 7.5 g/dL (ref 6.5–8.1)

## 2024-04-04 LAB — URINALYSIS, ROUTINE W REFLEX MICROSCOPIC
Bacteria, UA: NONE SEEN
Bilirubin Urine: NEGATIVE
Glucose, UA: NEGATIVE mg/dL
Ketones, ur: NEGATIVE mg/dL
Nitrite: NEGATIVE
Protein, ur: 30 mg/dL — AB
Specific Gravity, Urine: 1.021 (ref 1.005–1.030)
pH: 6 (ref 5.0–8.0)

## 2024-04-04 LAB — CBC
HCT: 45 % (ref 36.0–46.0)
Hemoglobin: 15.6 g/dL — ABNORMAL HIGH (ref 12.0–15.0)
MCH: 30.5 pg (ref 26.0–34.0)
MCHC: 34.7 g/dL (ref 30.0–36.0)
MCV: 88.1 fL (ref 80.0–100.0)
Platelets: 289 K/uL (ref 150–400)
RBC: 5.11 MIL/uL (ref 3.87–5.11)
RDW: 13.3 % (ref 11.5–15.5)
WBC: 9.9 K/uL (ref 4.0–10.5)
nRBC: 0 % (ref 0.0–0.2)

## 2024-04-04 LAB — LIPASE, BLOOD: Lipase: 54 U/L — ABNORMAL HIGH (ref 11–51)

## 2024-04-04 MED ORDER — AMOXICILLIN-POT CLAVULANATE 875-125 MG PO TABS: 1.0000 | ORAL_TABLET | Freq: Two times a day (BID) | ORAL | 0 refills | Status: AC

## 2024-04-04 MED ORDER — IOHEXOL 300 MG/ML  SOLN
100.0000 mL | Freq: Once | INTRAMUSCULAR | Status: AC | PRN
  Administered 2024-04-04: 100 mL via INTRAVENOUS

## 2024-04-04 NOTE — ED Triage Notes (Signed)
 Pt reports LLQ abd pain x1 week along with nausea.

## 2024-04-04 NOTE — ED Provider Notes (Signed)
 "  EMERGENCY DEPARTMENT AT [facility] Provider Note   CSN: [id number] Arrival date & time: 04/04/24  1256     Patient presents with: Abdominal Pain   Sheryl Howell is a 69 y.o. female patient who presents to the emergency department today for further evaluation of left lower quadrant abdominal pain.  Pain initially started diffusely in the lower abdomen on Monday.  It is since now progressed to the left lower quadrant.  She does endorse some associated intermittent nausea without vomiting.  No changes in bowel habits.  No urinary symptoms.  No fever or chills.  Chart review does reveal a colonoscopy back in 2019 which did show scattered colonic diverticula.  Patient has not had a history of diverticulitis in the past.    Abdominal Pain      Prior to Admission medications  Medication Sig Start Date End Date Taking? Authorizing Provider  amoxicillin -clavulanate (AUGMENTIN ) 875-125 MG tablet Take 1 tablet by mouth every 12 (twelve) hours. 04/04/24  Yes Theotis, Tymon Nemetz M, PA-C  buPROPion (WELLBUTRIN XL) 300 MG 24 hr tablet Take 300 mg by mouth in the morning.    [provider]  busPIRone  (BUSPAR ) 7.5 MG tablet Take 7.5 mg by mouth 3 (three) times daily.    [provider]  Cholecalciferol (VITAMIN D -3 PO) Take 1 tablet by mouth every evening.    [provider]  citalopram  (CELEXA ) 20 MG tablet TAKE 1 TABLET BY MOUTH EVERY DAY Patient taking differently: Take 20 mg by mouth every evening. 08/28/20   Lenon Chiquita BROCKS, DO  Cyanocobalamin (VITAMIN B-12 PO) Take 1 tablet by mouth in the morning.    [provider]  MYRBETRIQ  25 MG TB24 tablet TAKE 1 TABLET (25 MG TOTAL) BY MOUTH DAILY. 03/05/24   Lorrane Pac, MD    Allergies: Sulfa antibiotics    Review of Systems  Gastrointestinal:  Positive for abdominal pain.  All other systems reviewed and are negative.   Updated Vital Signs BP 120/72   Pulse 74   Temp 97.8  F (36.6 C) (Oral)   Resp 16   Ht 5' 3 (1.6 m)   Wt 78.5 kg   SpO2 92%   BMI 30.65 kg/m   Physical Exam Vitals and nursing note reviewed.  Constitutional:      General: She is not in acute distress.    Appearance: Normal appearance.  HENT:     Head: Normocephalic and atraumatic.  Eyes:     General:        Right eye: No discharge.        Left eye: No discharge.  Cardiovascular:     Comments: Regular rate and rhythm.  S1/S2 are distinct without any evidence of murmur, rubs, or gallops.  Radial pulses are 2+ bilaterally.  Dorsalis pedis pulses are 2+ bilaterally.  No evidence of pedal edema. Pulmonary:     Comments: Clear to auscultation bilaterally.  Normal effort.  No respiratory distress.  No evidence of wheezes, rales, or rhonchi heard throughout. Abdominal:     General: Abdomen is flat. Bowel sounds are normal. There is no distension.     Tenderness: There is abdominal tenderness in the left lower quadrant. There is no guarding or rebound.  Musculoskeletal:        General: Normal range of motion.     Cervical back: Neck supple.  Skin:    General: Skin is warm and dry.     Findings: No rash.  Neurological:  General: No focal deficit present.     Mental Status: She is alert.  Psychiatric:        Mood and Affect: Mood normal.        Behavior: Behavior normal.     (all labs ordered are listed, but only abnormal results are displayed) Labs Reviewed  LIPASE, BLOOD - Abnormal; Notable for the following components:      Result Value   Lipase 54 (*)    All other components within normal limits  COMPREHENSIVE METABOLIC PANEL WITH GFR - Abnormal; Notable for the following components:   Glucose, Bld 109 (*)    Alkaline Phosphatase 128 (*)    All other components within normal limits  CBC - Abnormal; Notable for the following components:   Hemoglobin 15.6 (*)    All other components within normal limits  URINALYSIS, ROUTINE W REFLEX MICROSCOPIC - Abnormal; Notable for  the following components:   Hgb urine dipstick TRACE (*)    Protein, ur 30 (*)    Leukocytes,Ua SMALL (*)    Non Squamous Epithelial 0-5 (*)    All other components within normal limits    EKG: None  Radiology: CT ABDOMEN PELVIS W CONTRAST Result Date: 04/04/2024 EXAM: CT ABDOMEN AND PELVIS WITH CONTRAST 04/04/2024 04:12:59 PM TECHNIQUE: CT of the abdomen and pelvis was performed with the administration of 100 mL of iohexol  (OMNIPAQUE ) 300 MG/ML solution. Multiplanar reformatted images are provided for review. Automated exposure control, iterative reconstruction, and/or weight-based adjustment of the mA/kV was utilized to reduce the radiation dose to as low as reasonably achievable. COMPARISON: None available. CLINICAL HISTORY: LLQ abdominal pain. FINDINGS: LOWER CHEST: There is a large hiatal hernia with the entirety of the stomach above the diaphragm posterior to the heart. There is an organoaxial volvulus with the stomach inverted within the hernia sac (image 40 series 4). Although the stomach is inverted on its axis (180 degrees), there is no evidence of obstruction or twisting. LIVER: The liver is unremarkable. GALLBLADDER AND BILE DUCTS: Cholecystectomy. No biliary ductal dilatation. SPLEEN: No acute abnormality. PANCREAS: No acute abnormality. ADRENAL GLANDS: No acute abnormality. KIDNEYS, URETERS AND BLADDER: There is an intermediate density rounded lesion exophytic from the mid left kidney measuring 18 mm with intermediate density on postcontrast imaging. This lesion cannot be characterized on current exam. Consider MRI without contrast for further evaluation. No stones in the kidneys or ureters. No hydronephrosis. No perinephric or periureteral stranding. Urinary bladder is unremarkable. GI AND BOWEL: There is inflammatory process involving the descending colon. There is a large diverticulum measuring 15 mm on image 40 on series 2. There is extensive inflammation adjacent fat surrounding the  diverticulum as well as fluid along the left paracolic gutter. These findings are most consistent with acute diverticulitis involving the above diverticulum. No evidence of perforation or abscess. The small bowel is normal. The right colon is normal. There is no bowel obstruction. PERITONEUM AND RETROPERITONEUM: Fluid along the left paracolic gutter. No free air. VASCULATURE: Aorta is normal in caliber. LYMPH NODES: No lymphadenopathy. REPRODUCTIVE ORGANS: No acute abnormality. BONES AND SOFT TISSUES: No acute osseous abnormality. No focal soft tissue abnormality. IMPRESSION: 1. Acute diverticulitis of the descending colon. No evidence of perforation or abscess. 2. 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 No evidence of obstruction. 3. Indeterminate 18 mm exophytic left renal lesion. Recommend renal protocol MRI (preferred) or CT without and with contrast for further characterization. Electronically signed by: Norleen Boxer MD 04/04/2024 04:51 PM EST  RP Workstation: [id number]     Procedures   Medications Ordered in the ED  iohexol  (OMNIPAQUE ) 300 MG/ML solution 100 mL (100 mLs Intravenous Contrast Given 04/04/24 1559)    Clinical Course as of 04/04/24 1726  Sun Apr 04, 2024  1711 CBC(!) Negative. [CF]  1711 Comprehensive metabolic panel(!) Negative. [CF]  1711 Urinalysis, Routine w reflex microscopic -Urine, Clean Catch(!) There is some evidence of pyuria without any obvious urinary symptoms.  [CF]  1714 Lipase, blood(!) Elevated but outside the realm of pancreatitis. [CF]  1714 CT ABDOMEN PELVIS W CONTRAST There is evidence of diverticulitis.  I do agree with radiologist interpretation. [CF]    Clinical Course User Index [CF] Theotis Cameron HERO, PA-C    Medical Decision Making This patient presents to the ED for concern of left lower quad abdominal pain, this involves an extensive number of treatment options, and is a complaint that carries with it a high risk of  complications and morbidity.  The differential diagnosis includes diverticulitis, appendicitis, referred pain from cystitis, pyelonephritis although less likely as patient's not having any urinary symptoms, bowel obstruction.   Co morbidities that complicate the patient evaluation  Age   Additional history obtained:  Additional history and/or information obtained from chart review, notable for colonoscopy in 2019 which showed scattered colonic diverticula   Lab Tests:  I Ordered, and personally interpreted labs.  The pertinent results include: See ED clinical course   Imaging Studies ordered:  I ordered imaging studies including CT abdomen pelvis with contrast I independently visualized and interpreted imaging which showed see ED clinical course I agree with the radiologist interpretation  I notified patient of renal lesion and the need to follow-up with this in the outpatient setting.  I also notified her of her hiatal hernia.  Patient's currently asymptomatic from both of these things.  Will plan to have her evaluate this in the outpatient setting. She expressed full understanding.   Cardiac Monitoring:  The patient was maintained on a cardiac monitor.  I personally viewed and interpreted the cardiac monitored which showed an underlying rhythm of: Normal sinus   Medicines ordered and prescription drug management:  None.  Offered patient pain medication and she declined.   Test Considered:  None   Critical Interventions:  None    Problem List / ED Course:  See ED clinical course   Reevaluation:  After the interventions noted above, I reevaluated the patient and found that they have :stayed the same   Social Determinants of Health:  Health literacy   Disposition:  After consideration of the diagnostic results and the patients response to treatment, I feel that the patient would benefit from discharge home with Augmentin  for diverticulitis.  Will have  her follow-up with her primary care doctor for further evaluation.  Strict turn precautions were discussed.  She is safe for discharge.   Amount and/or Complexity of Data Reviewed Labs: ordered. Decision-making details documented in ED Course. Radiology: ordered. Decision-making details documented in ED Course.  Risk Prescription drug management.     Final diagnoses:  Diverticulitis    ED Discharge Orders          Ordered    amoxicillin -clavulanate (AUGMENTIN ) 875-125 MG tablet  Every 12 hours        04/04/24 1724               Theotis Cameron Waves, [place] 04/04/24 1726    Ula Prentice SAUNDERS, MD 04/12/24 1515  "

## 2024-04-04 NOTE — Progress Notes (Signed)
 " Virtual Visit Consent   Sheryl Howell, you are scheduled for a virtual visit with a [facility] provider today. Just as with appointments in the office, your consent must be obtained to participate. Your consent will be active for this visit and any virtual visit you may have with one of our providers in the next 365 days. If you have a MyChart account, a copy of this consent can be sent to you electronically.  As this is a virtual visit, video technology does not allow for your provider to perform a traditional examination. This may limit your provider's ability to fully assess your condition. If your provider identifies any concerns that need to be evaluated in person or the need to arrange testing (such as labs, EKG, etc.), we will make arrangements to do so. Although advances in technology are sophisticated, we cannot ensure that it will always work on either your end or our end. If the connection with a video visit is poor, the visit may have to be switched to a telephone visit. With either a video or telephone visit, we are not always able to ensure that we have a secure connection.  By engaging in this virtual visit, you consent to the provision of healthcare and authorize for your insurance to be billed (if applicable) for the services provided during this visit. Depending on your insurance coverage, you may receive a charge related to this service.  I need to obtain your verbal consent now. Are you willing to proceed with your visit today? Sheryl Howell has provided verbal consent on 04/04/2024 for a virtual visit (video or telephone). Bari Learn, FNP  Date: 04/04/2024 12:17 PM   Virtual Visit via Video Note   I, Bari Learn, connected with  Sheryl Howell  ([id number], February 08, 1956) on 04/04/24 at 12:15 PM EST by a video-enabled telemedicine application and verified that I am speaking with the correct person using two identifiers.  Location: Patient:  Virtual Visit Location Patient: Home Provider: Virtual Visit Location Provider: Home Office   I discussed the limitations of evaluation and management by telemedicine and the availability of in person appointments. The patient expressed understanding and agreed to proceed.    History of Present Illness: Sheryl Howell is a 69 y.o. who identifies as a female who was assigned female at birth, and is being seen today for lower abdominal pain for the last week.  HPI: Abdominal Pain This is a new problem. The current episode started 1 to 4 weeks ago. The onset quality is gradual. The problem occurs constantly. The problem has been gradually worsening. The pain is located in the LLQ. The pain is at a severity of 8/10. The pain is mild. The quality of the pain is aching. Pertinent negatives include no belching, constipation, diarrhea, dysuria or fever.    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  Resting comfortably  at home.  Head is  normocephalic, atraumatic.  No labored breathing.  Speech is clear and coherent with logical content.  Patient is alert and oriented at baseline.    Assessment and Plan: 1. Left lower quadrant abdominal pain (Primary)  Worrisome for diverticulitis Needs to be seen face to face today NPO until cleared, will more than likely need CT scan    Follow Up Instructions: I discussed the assessment and treatment plan with the patient. The patient was provided an opportunity to ask questions and all were answered. The patient agreed with the plan and demonstrated an understanding of the instructions.  A copy of instructions were sent to the patient via MyChart unless otherwise noted below.     The patient was advised to call back or seek an in-person evaluation if the symptoms worsen or if the condition fails to improve as anticipated.    Bari Learn, FNP    [1]  Allergies Allergen Reactions   Sulfa Antibiotics Nausea Only  [2]  Current Outpatient Medications:    buPROPion (WELLBUTRIN XL) 300 MG 24 hr tablet, Take 300 mg by mouth in the morning., Disp: , Rfl:    busPIRone  (BUSPAR ) 7.5 MG tablet, Take 7.5 mg by mouth 3 (three) times daily., Disp: , Rfl:    Cholecalciferol (VITAMIN D -3 PO), Take 1 tablet by mouth every evening., Disp: , Rfl:    citalopram  (CELEXA ) 20 MG tablet, TAKE 1 TABLET BY MOUTH EVERY DAY (Patient taking differently: Take 20 mg by mouth every evening.), Disp: 90 tablet, Rfl: 2   Cyanocobalamin (VITAMIN B-12 PO), Take 1 tablet by mouth in the morning., Disp: , Rfl:    MYRBETRIQ  25 MG TB24 tablet, TAKE 1 TABLET (25 MG TOTAL) BY MOUTH DAILY., Disp: 90 tablet, Rfl: 0  "

## 2024-04-04 NOTE — Discharge Instructions (Addendum)
 As we discussed, you were diagnosed with diverticulitis today.  Please take antibiotics as prescribed.  Please follow-up with your primary care doctor.  In addition, we also talked about the hiatal hernia that you have.  And the renal lesion (Kidney lesion) that needs to be evaluated in the outpatient setting as well.  Please get this evaluated with your PCP and you may return to the emergency room if any worsening symptoms.

## 2024-04-05 ENCOUNTER — Ambulatory Visit

## 2024-04-05 VITALS — BP 121/85 | HR 85 | Ht 63.0 in | Wt 172.2 lb

## 2024-04-05 DIAGNOSIS — K5732 Diverticulitis of large intestine without perforation or abscess without bleeding: Secondary | ICD-10-CM | POA: Diagnosis not present

## 2024-04-05 DIAGNOSIS — N2889 Other specified disorders of kidney and ureter: Secondary | ICD-10-CM | POA: Diagnosis not present

## 2024-04-05 DIAGNOSIS — K449 Diaphragmatic hernia without obstruction or gangrene: Secondary | ICD-10-CM | POA: Diagnosis not present

## 2024-04-05 MED ORDER — LORAZEPAM 0.5 MG PO TABS: 0.5000 mg | ORAL_TABLET | Freq: Once | ORAL | 0 refills | Status: AC | PRN

## 2024-04-05 NOTE — Patient Instructions (Signed)
 Thank you for visiting the clinic today, it was good to see you!  Please always bring your medication bottles  In today's visit we discussed:  Diverticulitis: Continue taking your Augmentin  until completed, encourage you to take more fiber every day.  Also strive to achieve adequate hydration drinking lots of water and taking MiraLAX as needed to have soft stool with no straining every day.  Hiatal hernia:-Referral for general surgery to evaluate whether the imaging is indicative of surgery.  Renal mass: I put an order for an MRI to further characterize the renal mass, I will call you regarding the results once they have arrived.  I have also put in an order for as needed lorazepam , take 30 minutes prior to the procedure.  In order to take this medication safely you need a show for her to drive you to and from the procedure.  Please follow-up in 1 year or earlier as needed.  For any questions, please call the office at [phone] or send me a message in MyChart. Have a great day!  -Fairy Amy, MD  [facility] Resident, PGY-1

## 2024-04-05 NOTE — Progress Notes (Signed)
" ° ° °  SUBJECTIVE:   CHIEF COMPLAINT / HPI:   Diverticulitis: Symptoms have improved with augmentin , denies any N/V or diarrhea, fever, changes with stool. Abdominal pain has improved considerably. Has regular BM's, sometimes has hard stools with straining.  Hiatal hernia: Denies any symptoms of retasting food, heart burn, or belching/burping. Abdominal CT found large hiatal hernia with volvulus of stomach w/o obstruction and would like further evaluation.  Renal mass: Reports some darkening of urine recently. No blood in urine, dysuria, changes with urinary urgency. Reports her sister had cervical and mouth cancer.  Unclear family history of RCC.  Reports there have been many smokers in her home for many decades, though she has never smoked herself.  PERTINENT  PMH / PSH: Urge incontinence, anxiety and depression, history of hepatitis B viral infection, HLD, anemia. Cholecystectomy.  OBJECTIVE:   Ht 5' 3 (1.6 m)   Wt 172 lb 3.2 oz (78.1 kg)   BMI 30.50 kg/m    General: Well-appearing, no acute distress Cardiac: Regular rate and rhythm. Normal S1/S2. No murmurs, rubs, or gallops appreciated. Lungs: Clear bilaterally to ascultation.  Abdomen: Normoactive bowel sounds. No tenderness to deep or light palpation. No rebound or guarding.   Psych: Pleasant and appropriate    ASSESSMENT/PLAN:   Assessment & Plan Diverticulitis of large intestine without perforation or abscess without bleeding Resolved, initially presented yesterday with worsening nausea, vomiting, poor p.o., and severe LLQ abdominal pain.  Has been compliant with Augmentin  course.  Some mild symptoms of constipation. Continue Augmentin  twice daily for total of 7-day course Encouraged high-fiber diet, handout provided Daily MiraLAX as needed to achieve soft regular BM without straining Hiatal hernia Currently asymptomatic, patient very alarmed by radiology report.  CT abdomen pelvis 04/04/2024 found large hiatal hernia with  organoaxial volvulus of stomach without evidence of obstruction. Referral placed for general surgery, patient would prefer specialist to be located in [place] Other specified disorders of kidney and ureter Also asymptomatic, concerned about 18 mm exophytic left renal lesion.  Family history of cancer though unclear whether renal cancer.  Secondhand smoke exposure history, no primary tobacco use. MRI renal protocol ordered, will follow-up with patient regarding imaging results whether urology consult indicated. Order for as needed lorazepam  for procedure given patient's concern for procedural anxiety.   Sheryl Amy, MD [facility] "

## 2024-04-14 ENCOUNTER — Ambulatory Visit (HOSPITAL_COMMUNITY)
Admission: RE | Admit: 2024-04-14 | Discharge: 2024-04-14 | Disposition: A | Discharge status: Home / Self Care | Source: Ambulatory Visit | Attending: Family Medicine | Admitting: Family Medicine

## 2024-04-14 DIAGNOSIS — N2889 Other specified disorders of kidney and ureter: Secondary | ICD-10-CM | POA: Insufficient documentation

## 2024-04-14 MED ORDER — GADOBUTROL 1 MMOL/ML IV SOLN
8.0000 mL | Freq: Once | INTRAVENOUS | Status: AC | PRN
  Administered 2024-04-14: 8 mL via INTRAVENOUS

## 2024-04-16 ENCOUNTER — Ambulatory Visit: Payer: Self-pay

## 2024-04-16 DIAGNOSIS — K8689 Other specified diseases of pancreas: Secondary | ICD-10-CM | POA: Insufficient documentation
